# Patient Record
Sex: Male | Born: 1975
Health system: Southern US, Community
[De-identification: ages and names within clinical notes are randomized; demographics above are authoritative.]

## PROBLEM LIST (undated history)

## (undated) DIAGNOSIS — F819 Developmental disorder of scholastic skills, unspecified: Secondary | ICD-10-CM

## (undated) DIAGNOSIS — K219 Gastro-esophageal reflux disease without esophagitis: Secondary | ICD-10-CM

## (undated) DIAGNOSIS — F319 Bipolar disorder, unspecified: Secondary | ICD-10-CM

---

## 2003-11-29 ENCOUNTER — Encounter: Admission: RE | Admit: 2003-11-29 | Discharge: 2003-11-29 | Payer: Self-pay | Admitting: Family Medicine

## 2009-02-10 ENCOUNTER — Encounter: Admission: RE | Admit: 2009-02-10 | Discharge: 2009-02-10 | Payer: Self-pay | Admitting: Gastroenterology

## 2009-04-09 ENCOUNTER — Ambulatory Visit (HOSPITAL_COMMUNITY): Admission: RE | Admit: 2009-04-09 | Discharge: 2009-04-09 | Payer: Self-pay | Admitting: Gastroenterology

## 2009-05-03 HISTORY — PX: LAPAROSCOPIC CHOLECYSTECTOMY: SUR755

## 2009-05-08 ENCOUNTER — Encounter (INDEPENDENT_AMBULATORY_CARE_PROVIDER_SITE_OTHER): Payer: Self-pay | Admitting: Surgery

## 2009-05-08 ENCOUNTER — Ambulatory Visit (HOSPITAL_COMMUNITY): Admission: RE | Admit: 2009-05-08 | Discharge: 2009-05-09 | Payer: Self-pay | Admitting: Surgery

## 2010-05-24 ENCOUNTER — Encounter: Payer: Self-pay | Admitting: Gastroenterology

## 2010-07-19 LAB — DIFFERENTIAL
Basophils Relative: 0 % (ref 0–1)
Lymphs Abs: 0.5 10*3/uL — ABNORMAL LOW (ref 0.7–4.0)
Monocytes Relative: 1 % — ABNORMAL LOW (ref 3–12)
Neutrophils Relative %: 96 % — ABNORMAL HIGH (ref 43–77)

## 2010-07-19 LAB — CBC
HCT: 45.6 % (ref 39.0–52.0)
MCV: 90.1 fL (ref 78.0–100.0)
Platelets: 178 10*3/uL (ref 150–400)
RBC: 5.06 MIL/uL (ref 4.22–5.81)

## 2010-07-19 LAB — COMPREHENSIVE METABOLIC PANEL
ALT: 27 U/L (ref 0–53)
AST: 31 U/L (ref 0–37)
Albumin: 3.6 g/dL (ref 3.5–5.2)
Alkaline Phosphatase: 79 U/L (ref 39–117)
CO2: 22 mEq/L (ref 19–32)
Creatinine, Ser: 0.93 mg/dL (ref 0.4–1.5)
GFR calc non Af Amer: >60 mL/min (ref >60)
Total Bilirubin: 0.6 mg/dL (ref 0.3–1.2)

## 2012-05-10 DIAGNOSIS — F3189 Other bipolar disorder: Secondary | ICD-10-CM | POA: Diagnosis not present

## 2012-06-20 DIAGNOSIS — Z79899 Other long term (current) drug therapy: Secondary | ICD-10-CM | POA: Diagnosis not present

## 2012-07-20 DIAGNOSIS — Z79899 Other long term (current) drug therapy: Secondary | ICD-10-CM | POA: Diagnosis not present

## 2012-08-15 DIAGNOSIS — F3189 Other bipolar disorder: Secondary | ICD-10-CM | POA: Diagnosis not present

## 2012-08-16 DIAGNOSIS — Z79899 Other long term (current) drug therapy: Secondary | ICD-10-CM | POA: Diagnosis not present

## 2012-09-19 DIAGNOSIS — Z79899 Other long term (current) drug therapy: Secondary | ICD-10-CM | POA: Diagnosis not present

## 2012-09-29 DIAGNOSIS — Z79899 Other long term (current) drug therapy: Secondary | ICD-10-CM | POA: Diagnosis not present

## 2012-09-29 DIAGNOSIS — F319 Bipolar disorder, unspecified: Secondary | ICD-10-CM | POA: Diagnosis not present

## 2012-09-29 DIAGNOSIS — D485 Neoplasm of uncertain behavior of skin: Secondary | ICD-10-CM | POA: Diagnosis not present

## 2012-09-29 DIAGNOSIS — E7800 Pure hypercholesterolemia, unspecified: Secondary | ICD-10-CM | POA: Diagnosis not present

## 2012-09-29 DIAGNOSIS — F79 Unspecified intellectual disabilities: Secondary | ICD-10-CM | POA: Diagnosis not present

## 2012-09-29 DIAGNOSIS — Z833 Family history of diabetes mellitus: Secondary | ICD-10-CM | POA: Diagnosis not present

## 2012-10-17 DIAGNOSIS — D2360 Other benign neoplasm of skin of unspecified upper limb, including shoulder: Secondary | ICD-10-CM | POA: Diagnosis not present

## 2012-10-18 ENCOUNTER — Other Ambulatory Visit: Payer: Self-pay | Admitting: Family Medicine

## 2012-10-18 DIAGNOSIS — D485 Neoplasm of uncertain behavior of skin: Secondary | ICD-10-CM | POA: Diagnosis not present

## 2012-10-20 DIAGNOSIS — Z79899 Other long term (current) drug therapy: Secondary | ICD-10-CM | POA: Diagnosis not present

## 2012-11-07 DIAGNOSIS — F3189 Other bipolar disorder: Secondary | ICD-10-CM | POA: Diagnosis not present

## 2012-11-17 DIAGNOSIS — Z79899 Other long term (current) drug therapy: Secondary | ICD-10-CM | POA: Diagnosis not present

## 2012-12-15 DIAGNOSIS — Z79899 Other long term (current) drug therapy: Secondary | ICD-10-CM | POA: Diagnosis not present

## 2013-01-18 DIAGNOSIS — Z79899 Other long term (current) drug therapy: Secondary | ICD-10-CM | POA: Diagnosis not present

## 2013-01-30 DIAGNOSIS — F3189 Other bipolar disorder: Secondary | ICD-10-CM | POA: Diagnosis not present

## 2013-02-16 DIAGNOSIS — Z79899 Other long term (current) drug therapy: Secondary | ICD-10-CM | POA: Diagnosis not present

## 2013-03-15 DIAGNOSIS — Z79899 Other long term (current) drug therapy: Secondary | ICD-10-CM | POA: Diagnosis not present

## 2013-04-10 DIAGNOSIS — F70 Mild intellectual disabilities: Secondary | ICD-10-CM | POA: Diagnosis not present

## 2013-04-12 DIAGNOSIS — Z79899 Other long term (current) drug therapy: Secondary | ICD-10-CM | POA: Diagnosis not present

## 2013-05-14 DIAGNOSIS — Z79899 Other long term (current) drug therapy: Secondary | ICD-10-CM | POA: Diagnosis not present

## 2013-06-14 DIAGNOSIS — Z79899 Other long term (current) drug therapy: Secondary | ICD-10-CM | POA: Diagnosis not present

## 2013-07-17 DIAGNOSIS — Z79899 Other long term (current) drug therapy: Secondary | ICD-10-CM | POA: Diagnosis not present

## 2013-08-14 DIAGNOSIS — F70 Mild intellectual disabilities: Secondary | ICD-10-CM | POA: Diagnosis not present

## 2013-08-14 DIAGNOSIS — F3130 Bipolar disorder, current episode depressed, mild or moderate severity, unspecified: Secondary | ICD-10-CM | POA: Diagnosis not present

## 2013-08-15 DIAGNOSIS — Z79899 Other long term (current) drug therapy: Secondary | ICD-10-CM | POA: Diagnosis not present

## 2013-09-17 DIAGNOSIS — Z79899 Other long term (current) drug therapy: Secondary | ICD-10-CM | POA: Diagnosis not present

## 2013-10-05 DIAGNOSIS — Z0000 Encounter for general adult medical examination without abnormal findings: Secondary | ICD-10-CM | POA: Diagnosis not present

## 2013-10-05 DIAGNOSIS — Z79899 Other long term (current) drug therapy: Secondary | ICD-10-CM | POA: Diagnosis not present

## 2013-10-05 DIAGNOSIS — Z131 Encounter for screening for diabetes mellitus: Secondary | ICD-10-CM | POA: Diagnosis not present

## 2013-10-05 DIAGNOSIS — F319 Bipolar disorder, unspecified: Secondary | ICD-10-CM | POA: Diagnosis not present

## 2013-10-05 DIAGNOSIS — Z136 Encounter for screening for cardiovascular disorders: Secondary | ICD-10-CM | POA: Diagnosis not present

## 2013-10-15 DIAGNOSIS — Z79899 Other long term (current) drug therapy: Secondary | ICD-10-CM | POA: Diagnosis not present

## 2013-11-06 DIAGNOSIS — F3130 Bipolar disorder, current episode depressed, mild or moderate severity, unspecified: Secondary | ICD-10-CM | POA: Diagnosis not present

## 2013-11-06 DIAGNOSIS — F70 Mild intellectual disabilities: Secondary | ICD-10-CM | POA: Diagnosis not present

## 2013-11-14 DIAGNOSIS — Z79899 Other long term (current) drug therapy: Secondary | ICD-10-CM | POA: Diagnosis not present

## 2013-11-20 ENCOUNTER — Other Ambulatory Visit: Payer: Self-pay | Admitting: Family Medicine

## 2013-11-20 DIAGNOSIS — D235 Other benign neoplasm of skin of trunk: Secondary | ICD-10-CM | POA: Diagnosis not present

## 2013-12-07 DIAGNOSIS — B029 Zoster without complications: Secondary | ICD-10-CM | POA: Diagnosis not present

## 2013-12-07 DIAGNOSIS — L0100 Impetigo, unspecified: Secondary | ICD-10-CM | POA: Diagnosis not present

## 2013-12-10 DIAGNOSIS — L0100 Impetigo, unspecified: Secondary | ICD-10-CM | POA: Diagnosis not present

## 2013-12-10 DIAGNOSIS — B029 Zoster without complications: Secondary | ICD-10-CM | POA: Diagnosis not present

## 2013-12-17 DIAGNOSIS — Z79899 Other long term (current) drug therapy: Secondary | ICD-10-CM | POA: Diagnosis not present

## 2014-01-15 DIAGNOSIS — Z79899 Other long term (current) drug therapy: Secondary | ICD-10-CM | POA: Diagnosis not present

## 2014-02-05 DIAGNOSIS — F70 Mild intellectual disabilities: Secondary | ICD-10-CM | POA: Diagnosis not present

## 2014-02-05 DIAGNOSIS — F3181 Bipolar II disorder: Secondary | ICD-10-CM | POA: Diagnosis not present

## 2014-02-11 DIAGNOSIS — Z79899 Other long term (current) drug therapy: Secondary | ICD-10-CM | POA: Diagnosis not present

## 2014-03-18 DIAGNOSIS — Z79899 Other long term (current) drug therapy: Secondary | ICD-10-CM | POA: Diagnosis not present

## 2014-04-15 DIAGNOSIS — Z79899 Other long term (current) drug therapy: Secondary | ICD-10-CM | POA: Diagnosis not present

## 2014-04-16 DIAGNOSIS — R590 Localized enlarged lymph nodes: Secondary | ICD-10-CM | POA: Diagnosis not present

## 2014-04-16 DIAGNOSIS — H00015 Hordeolum externum left lower eyelid: Secondary | ICD-10-CM | POA: Diagnosis not present

## 2014-04-16 DIAGNOSIS — D2220 Melanocytic nevi of unspecified ear and external auricular canal: Secondary | ICD-10-CM | POA: Diagnosis not present

## 2014-05-06 DIAGNOSIS — H029 Unspecified disorder of eyelid: Secondary | ICD-10-CM | POA: Diagnosis not present

## 2014-05-06 DIAGNOSIS — D2220 Melanocytic nevi of unspecified ear and external auricular canal: Secondary | ICD-10-CM | POA: Diagnosis not present

## 2014-05-08 DIAGNOSIS — F70 Mild intellectual disabilities: Secondary | ICD-10-CM | POA: Diagnosis not present

## 2014-05-08 DIAGNOSIS — F3174 Bipolar disorder, in full remission, most recent episode manic: Secondary | ICD-10-CM | POA: Diagnosis not present

## 2014-05-13 DIAGNOSIS — H02825 Cysts of left lower eyelid: Secondary | ICD-10-CM | POA: Diagnosis not present

## 2014-05-15 DIAGNOSIS — Z79899 Other long term (current) drug therapy: Secondary | ICD-10-CM | POA: Diagnosis not present

## 2014-05-21 ENCOUNTER — Other Ambulatory Visit: Payer: Self-pay | Admitting: Dermatology

## 2014-05-21 DIAGNOSIS — D2221 Melanocytic nevi of right ear and external auricular canal: Secondary | ICD-10-CM | POA: Diagnosis not present

## 2014-05-21 DIAGNOSIS — L821 Other seborrheic keratosis: Secondary | ICD-10-CM | POA: Diagnosis not present

## 2014-05-21 DIAGNOSIS — D2312 Other benign neoplasm of skin of left eyelid, including canthus: Secondary | ICD-10-CM | POA: Diagnosis not present

## 2014-06-13 DIAGNOSIS — Z79899 Other long term (current) drug therapy: Secondary | ICD-10-CM | POA: Diagnosis not present

## 2014-07-09 DIAGNOSIS — B351 Tinea unguium: Secondary | ICD-10-CM | POA: Diagnosis not present

## 2014-07-18 DIAGNOSIS — Z79899 Other long term (current) drug therapy: Secondary | ICD-10-CM | POA: Diagnosis not present

## 2014-08-07 DIAGNOSIS — F3132 Bipolar disorder, current episode depressed, moderate: Secondary | ICD-10-CM | POA: Diagnosis not present

## 2014-08-07 DIAGNOSIS — F70 Mild intellectual disabilities: Secondary | ICD-10-CM | POA: Diagnosis not present

## 2014-08-08 DIAGNOSIS — Z79899 Other long term (current) drug therapy: Secondary | ICD-10-CM | POA: Diagnosis not present

## 2014-09-09 DIAGNOSIS — Z79899 Other long term (current) drug therapy: Secondary | ICD-10-CM | POA: Diagnosis not present

## 2014-10-16 DIAGNOSIS — Z79899 Other long term (current) drug therapy: Secondary | ICD-10-CM | POA: Diagnosis not present

## 2014-10-24 DIAGNOSIS — Z0001 Encounter for general adult medical examination with abnormal findings: Secondary | ICD-10-CM | POA: Diagnosis not present

## 2014-10-24 DIAGNOSIS — F319 Bipolar disorder, unspecified: Secondary | ICD-10-CM | POA: Diagnosis not present

## 2014-10-24 DIAGNOSIS — F79 Unspecified intellectual disabilities: Secondary | ICD-10-CM | POA: Diagnosis not present

## 2014-11-18 DIAGNOSIS — Z79899 Other long term (current) drug therapy: Secondary | ICD-10-CM | POA: Diagnosis not present

## 2014-12-17 DIAGNOSIS — F70 Mild intellectual disabilities: Secondary | ICD-10-CM | POA: Diagnosis not present

## 2014-12-17 DIAGNOSIS — F3132 Bipolar disorder, current episode depressed, moderate: Secondary | ICD-10-CM | POA: Diagnosis not present

## 2014-12-19 DIAGNOSIS — Z79899 Other long term (current) drug therapy: Secondary | ICD-10-CM | POA: Diagnosis not present

## 2015-01-20 DIAGNOSIS — Z79899 Other long term (current) drug therapy: Secondary | ICD-10-CM | POA: Diagnosis not present

## 2015-02-20 DIAGNOSIS — Z79899 Other long term (current) drug therapy: Secondary | ICD-10-CM | POA: Diagnosis not present

## 2015-03-11 DIAGNOSIS — F70 Mild intellectual disabilities: Secondary | ICD-10-CM | POA: Diagnosis not present

## 2015-03-11 DIAGNOSIS — F3132 Bipolar disorder, current episode depressed, moderate: Secondary | ICD-10-CM | POA: Diagnosis not present

## 2015-03-25 DIAGNOSIS — Z79899 Other long term (current) drug therapy: Secondary | ICD-10-CM | POA: Diagnosis not present

## 2015-04-23 DIAGNOSIS — Z79899 Other long term (current) drug therapy: Secondary | ICD-10-CM | POA: Diagnosis not present

## 2015-05-22 DIAGNOSIS — L723 Sebaceous cyst: Secondary | ICD-10-CM | POA: Diagnosis not present

## 2015-05-26 DIAGNOSIS — Z79899 Other long term (current) drug therapy: Secondary | ICD-10-CM | POA: Diagnosis not present

## 2015-06-26 DIAGNOSIS — Z79899 Other long term (current) drug therapy: Secondary | ICD-10-CM | POA: Diagnosis not present

## 2015-07-23 DIAGNOSIS — Z79899 Other long term (current) drug therapy: Secondary | ICD-10-CM | POA: Diagnosis not present

## 2015-08-25 DIAGNOSIS — Z79899 Other long term (current) drug therapy: Secondary | ICD-10-CM | POA: Diagnosis not present

## 2015-08-26 DIAGNOSIS — F3132 Bipolar disorder, current episode depressed, moderate: Secondary | ICD-10-CM | POA: Diagnosis not present

## 2015-08-26 DIAGNOSIS — F70 Mild intellectual disabilities: Secondary | ICD-10-CM | POA: Diagnosis not present

## 2015-09-25 DIAGNOSIS — Z79899 Other long term (current) drug therapy: Secondary | ICD-10-CM | POA: Diagnosis not present

## 2015-10-27 DIAGNOSIS — Z79899 Other long term (current) drug therapy: Secondary | ICD-10-CM | POA: Diagnosis not present

## 2015-10-31 DIAGNOSIS — F79 Unspecified intellectual disabilities: Secondary | ICD-10-CM | POA: Diagnosis not present

## 2015-10-31 DIAGNOSIS — Z131 Encounter for screening for diabetes mellitus: Secondary | ICD-10-CM | POA: Diagnosis not present

## 2015-10-31 DIAGNOSIS — Z0001 Encounter for general adult medical examination with abnormal findings: Secondary | ICD-10-CM | POA: Diagnosis not present

## 2015-10-31 DIAGNOSIS — L723 Sebaceous cyst: Secondary | ICD-10-CM | POA: Diagnosis not present

## 2015-10-31 DIAGNOSIS — M24569 Contracture, unspecified knee: Secondary | ICD-10-CM | POA: Diagnosis not present

## 2015-10-31 DIAGNOSIS — E7800 Pure hypercholesterolemia, unspecified: Secondary | ICD-10-CM | POA: Diagnosis not present

## 2015-10-31 DIAGNOSIS — B081 Molluscum contagiosum: Secondary | ICD-10-CM | POA: Diagnosis not present

## 2015-11-26 DIAGNOSIS — Z79899 Other long term (current) drug therapy: Secondary | ICD-10-CM | POA: Diagnosis not present

## 2015-12-25 DIAGNOSIS — Z79899 Other long term (current) drug therapy: Secondary | ICD-10-CM | POA: Diagnosis not present

## 2016-01-30 DIAGNOSIS — Z79899 Other long term (current) drug therapy: Secondary | ICD-10-CM | POA: Diagnosis not present

## 2016-02-03 DIAGNOSIS — F3132 Bipolar disorder, current episode depressed, moderate: Secondary | ICD-10-CM | POA: Diagnosis not present

## 2016-02-03 DIAGNOSIS — F209 Schizophrenia, unspecified: Secondary | ICD-10-CM | POA: Diagnosis not present

## 2016-02-26 DIAGNOSIS — F3132 Bipolar disorder, current episode depressed, moderate: Secondary | ICD-10-CM | POA: Diagnosis not present

## 2016-02-26 DIAGNOSIS — F209 Schizophrenia, unspecified: Secondary | ICD-10-CM | POA: Diagnosis not present

## 2016-03-23 DIAGNOSIS — H01006 Unspecified blepharitis left eye, unspecified eyelid: Secondary | ICD-10-CM | POA: Diagnosis not present

## 2016-04-01 DIAGNOSIS — Z79899 Other long term (current) drug therapy: Secondary | ICD-10-CM | POA: Diagnosis not present

## 2016-04-01 DIAGNOSIS — F209 Schizophrenia, unspecified: Secondary | ICD-10-CM | POA: Diagnosis not present

## 2016-04-01 DIAGNOSIS — F3132 Bipolar disorder, current episode depressed, moderate: Secondary | ICD-10-CM | POA: Diagnosis not present

## 2016-04-21 DIAGNOSIS — F28 Other psychotic disorder not due to a substance or known physiological condition: Secondary | ICD-10-CM | POA: Diagnosis not present

## 2016-04-21 DIAGNOSIS — F70 Mild intellectual disabilities: Secondary | ICD-10-CM | POA: Diagnosis not present

## 2016-04-29 DIAGNOSIS — F3132 Bipolar disorder, current episode depressed, moderate: Secondary | ICD-10-CM | POA: Diagnosis not present

## 2016-04-29 DIAGNOSIS — F209 Schizophrenia, unspecified: Secondary | ICD-10-CM | POA: Diagnosis not present

## 2016-05-28 DIAGNOSIS — F3132 Bipolar disorder, current episode depressed, moderate: Secondary | ICD-10-CM | POA: Diagnosis not present

## 2016-05-28 DIAGNOSIS — F209 Schizophrenia, unspecified: Secondary | ICD-10-CM | POA: Diagnosis not present

## 2016-05-31 DIAGNOSIS — B351 Tinea unguium: Secondary | ICD-10-CM | POA: Diagnosis not present

## 2016-06-11 DIAGNOSIS — B351 Tinea unguium: Secondary | ICD-10-CM | POA: Diagnosis not present

## 2016-06-29 DIAGNOSIS — F209 Schizophrenia, unspecified: Secondary | ICD-10-CM | POA: Diagnosis not present

## 2016-06-29 DIAGNOSIS — F3132 Bipolar disorder, current episode depressed, moderate: Secondary | ICD-10-CM | POA: Diagnosis not present

## 2016-07-21 DIAGNOSIS — F28 Other psychotic disorder not due to a substance or known physiological condition: Secondary | ICD-10-CM | POA: Diagnosis not present

## 2016-07-21 DIAGNOSIS — F70 Mild intellectual disabilities: Secondary | ICD-10-CM | POA: Diagnosis not present

## 2016-08-02 DIAGNOSIS — F3132 Bipolar disorder, current episode depressed, moderate: Secondary | ICD-10-CM | POA: Diagnosis not present

## 2016-08-02 DIAGNOSIS — F209 Schizophrenia, unspecified: Secondary | ICD-10-CM | POA: Diagnosis not present

## 2016-08-30 DIAGNOSIS — F3132 Bipolar disorder, current episode depressed, moderate: Secondary | ICD-10-CM | POA: Diagnosis not present

## 2016-08-30 DIAGNOSIS — F209 Schizophrenia, unspecified: Secondary | ICD-10-CM | POA: Diagnosis not present

## 2016-09-30 DIAGNOSIS — F3132 Bipolar disorder, current episode depressed, moderate: Secondary | ICD-10-CM | POA: Diagnosis not present

## 2016-09-30 DIAGNOSIS — F209 Schizophrenia, unspecified: Secondary | ICD-10-CM | POA: Diagnosis not present

## 2016-10-26 DIAGNOSIS — F70 Mild intellectual disabilities: Secondary | ICD-10-CM | POA: Diagnosis not present

## 2016-10-26 DIAGNOSIS — F28 Other psychotic disorder not due to a substance or known physiological condition: Secondary | ICD-10-CM | POA: Diagnosis not present

## 2016-10-27 DIAGNOSIS — F3132 Bipolar disorder, current episode depressed, moderate: Secondary | ICD-10-CM | POA: Diagnosis not present

## 2016-10-27 DIAGNOSIS — F209 Schizophrenia, unspecified: Secondary | ICD-10-CM | POA: Diagnosis not present

## 2016-11-05 DIAGNOSIS — Z79899 Other long term (current) drug therapy: Secondary | ICD-10-CM | POA: Diagnosis not present

## 2016-11-22 DIAGNOSIS — F3181 Bipolar II disorder: Secondary | ICD-10-CM | POA: Diagnosis not present

## 2016-11-22 DIAGNOSIS — M24569 Contracture, unspecified knee: Secondary | ICD-10-CM | POA: Diagnosis not present

## 2016-11-22 DIAGNOSIS — F79 Unspecified intellectual disabilities: Secondary | ICD-10-CM | POA: Diagnosis not present

## 2016-11-22 DIAGNOSIS — Z79899 Other long term (current) drug therapy: Secondary | ICD-10-CM | POA: Diagnosis not present

## 2016-11-22 DIAGNOSIS — Z0000 Encounter for general adult medical examination without abnormal findings: Secondary | ICD-10-CM | POA: Diagnosis not present

## 2017-01-06 DIAGNOSIS — F3132 Bipolar disorder, current episode depressed, moderate: Secondary | ICD-10-CM | POA: Diagnosis not present

## 2017-01-06 DIAGNOSIS — F209 Schizophrenia, unspecified: Secondary | ICD-10-CM | POA: Diagnosis not present

## 2017-02-03 DIAGNOSIS — F209 Schizophrenia, unspecified: Secondary | ICD-10-CM | POA: Diagnosis not present

## 2017-02-03 DIAGNOSIS — F3132 Bipolar disorder, current episode depressed, moderate: Secondary | ICD-10-CM | POA: Diagnosis not present

## 2017-03-07 DIAGNOSIS — F28 Other psychotic disorder not due to a substance or known physiological condition: Secondary | ICD-10-CM | POA: Diagnosis not present

## 2017-03-07 DIAGNOSIS — F209 Schizophrenia, unspecified: Secondary | ICD-10-CM | POA: Diagnosis not present

## 2017-03-07 DIAGNOSIS — F70 Mild intellectual disabilities: Secondary | ICD-10-CM | POA: Diagnosis not present

## 2017-04-05 DIAGNOSIS — F209 Schizophrenia, unspecified: Secondary | ICD-10-CM | POA: Diagnosis not present

## 2017-04-05 DIAGNOSIS — F3132 Bipolar disorder, current episode depressed, moderate: Secondary | ICD-10-CM | POA: Diagnosis not present

## 2017-05-11 DIAGNOSIS — Z79899 Other long term (current) drug therapy: Secondary | ICD-10-CM | POA: Diagnosis not present

## 2017-05-11 DIAGNOSIS — F209 Schizophrenia, unspecified: Secondary | ICD-10-CM | POA: Diagnosis not present

## 2017-06-15 DIAGNOSIS — F28 Other psychotic disorder not due to a substance or known physiological condition: Secondary | ICD-10-CM | POA: Diagnosis not present

## 2017-06-15 DIAGNOSIS — Z79899 Other long term (current) drug therapy: Secondary | ICD-10-CM | POA: Diagnosis not present

## 2017-06-15 DIAGNOSIS — F70 Mild intellectual disabilities: Secondary | ICD-10-CM | POA: Diagnosis not present

## 2017-07-12 DIAGNOSIS — Z79899 Other long term (current) drug therapy: Secondary | ICD-10-CM | POA: Diagnosis not present

## 2017-08-02 ENCOUNTER — Encounter (HOSPITAL_COMMUNITY): Payer: Self-pay | Admitting: Internal Medicine

## 2017-08-02 ENCOUNTER — Inpatient Hospital Stay (HOSPITAL_COMMUNITY)
Admission: EM | Admit: 2017-08-02 | Discharge: 2017-08-06 | DRG: 381 | Disposition: A | Discharge status: Home / Self Care | Payer: Medicare Other | Attending: Internal Medicine | Admitting: Internal Medicine

## 2017-08-02 DIAGNOSIS — R000 Tachycardia, unspecified: Secondary | ICD-10-CM | POA: Diagnosis present

## 2017-08-02 DIAGNOSIS — Z888 Allergy status to other drugs, medicaments and biological substances status: Secondary | ICD-10-CM | POA: Diagnosis not present

## 2017-08-02 DIAGNOSIS — D62 Acute posthemorrhagic anemia: Secondary | ICD-10-CM | POA: Diagnosis present

## 2017-08-02 DIAGNOSIS — Z6832 Body mass index (BMI) 32.0-32.9, adult: Secondary | ICD-10-CM | POA: Diagnosis not present

## 2017-08-02 DIAGNOSIS — F79 Unspecified intellectual disabilities: Secondary | ICD-10-CM | POA: Diagnosis present

## 2017-08-02 DIAGNOSIS — D72829 Elevated white blood cell count, unspecified: Secondary | ICD-10-CM | POA: Diagnosis present

## 2017-08-02 DIAGNOSIS — K920 Hematemesis: Secondary | ICD-10-CM | POA: Diagnosis not present

## 2017-08-02 DIAGNOSIS — K2211 Ulcer of esophagus with bleeding: Secondary | ICD-10-CM | POA: Diagnosis present

## 2017-08-02 DIAGNOSIS — K209 Esophagitis, unspecified: Secondary | ICD-10-CM | POA: Diagnosis not present

## 2017-08-02 DIAGNOSIS — K210 Gastro-esophageal reflux disease with esophagitis: Secondary | ICD-10-CM | POA: Diagnosis present

## 2017-08-02 DIAGNOSIS — E876 Hypokalemia: Secondary | ICD-10-CM | POA: Diagnosis present

## 2017-08-02 DIAGNOSIS — Z7982 Long term (current) use of aspirin: Secondary | ICD-10-CM | POA: Diagnosis not present

## 2017-08-02 DIAGNOSIS — R109 Unspecified abdominal pain: Secondary | ICD-10-CM | POA: Diagnosis not present

## 2017-08-02 DIAGNOSIS — E669 Obesity, unspecified: Secondary | ICD-10-CM | POA: Diagnosis present

## 2017-08-02 DIAGNOSIS — K922 Gastrointestinal hemorrhage, unspecified: Secondary | ICD-10-CM | POA: Diagnosis not present

## 2017-08-02 DIAGNOSIS — F319 Bipolar disorder, unspecified: Secondary | ICD-10-CM | POA: Diagnosis present

## 2017-08-02 DIAGNOSIS — K449 Diaphragmatic hernia without obstruction or gangrene: Secondary | ICD-10-CM | POA: Diagnosis present

## 2017-08-02 DIAGNOSIS — D649 Anemia, unspecified: Secondary | ICD-10-CM | POA: Diagnosis not present

## 2017-08-02 HISTORY — DX: Bipolar disorder, unspecified: F31.9

## 2017-08-02 HISTORY — DX: Developmental disorder of scholastic skills, unspecified: F81.9

## 2017-08-02 LAB — COMPREHENSIVE METABOLIC PANEL
ALBUMIN: 3.3 g/dL — AB (ref 3.5–5.0)
ALK PHOS: 69 U/L (ref 38–126)
ALT: 20 U/L (ref 17–63)
ANION GAP: 6 (ref 5–15)
AST: 22 U/L (ref 15–41)
BILIRUBIN TOTAL: 0.6 mg/dL (ref 0.3–1.2)
BUN: 26 mg/dL — AB (ref 6–20)
CALCIUM: 8.8 mg/dL — AB (ref 8.9–10.3)
CO2: 25 mmol/L (ref 22–32)
CREATININE: 0.83 mg/dL (ref 0.61–1.24)
Chloride: 103 mmol/L (ref 101–111)
GFR calc Af Amer: >60 mL/min (ref >60)
GFR calc non Af Amer: >60 mL/min (ref >60)
GLUCOSE: 136 mg/dL — AB (ref 65–99)
Potassium: 3.4 mmol/L — ABNORMAL LOW (ref 3.5–5.1)
SODIUM: 134 mmol/L — AB (ref 135–145)
TOTAL PROTEIN: 6.6 g/dL (ref 6.5–8.1)

## 2017-08-02 LAB — URINALYSIS, ROUTINE W REFLEX MICROSCOPIC
Bilirubin Urine: NEGATIVE
GLUCOSE, UA: NEGATIVE mg/dL
Hgb urine dipstick: NEGATIVE
KETONES UR: 5 mg/dL — AB
Leukocytes, UA: NEGATIVE
NITRITE: NEGATIVE
PH: 5 (ref 5.0–8.0)
Protein, ur: 30 mg/dL — AB
Specific Gravity, Urine: 1.028 (ref 1.005–1.030)
Squamous Epithelial / LPF: NONE SEEN

## 2017-08-02 LAB — CBC
HCT: 39.4 % (ref 39.0–52.0)
Hemoglobin: 13.2 g/dL (ref 13.0–17.0)
MCH: 30.3 pg (ref 26.0–34.0)
MCHC: 33.5 g/dL (ref 30.0–36.0)
MCV: 90.4 fL (ref 78.0–100.0)
PLATELETS: 266 10*3/uL (ref 150–400)
RBC: 4.36 MIL/uL (ref 4.22–5.81)
RDW: 14.5 % (ref 11.5–15.5)
WBC: 16.6 10*3/uL — ABNORMAL HIGH (ref 4.0–10.5)

## 2017-08-02 LAB — TYPE AND SCREEN
ABO/RH(D): O POS
ANTIBODY SCREEN: NEGATIVE

## 2017-08-02 LAB — LIPASE, BLOOD: Lipase: 27 U/L (ref 11–51)

## 2017-08-02 LAB — HEMOGLOBIN AND HEMATOCRIT, BLOOD
HCT: 35 % — ABNORMAL LOW (ref 39.0–52.0)
HEMOGLOBIN: 11.3 g/dL — AB (ref 13.0–17.0)

## 2017-08-02 MED ORDER — SODIUM CHLORIDE 0.9 % IV BOLUS
1000.0000 mL | Freq: Once | INTRAVENOUS | Status: AC
  Administered 2017-08-02: 1000 mL via INTRAVENOUS

## 2017-08-02 MED ORDER — CLOZAPINE 100 MG PO TABS
400.0000 mg | ORAL_TABLET | Freq: Every evening | ORAL | Status: DC
  Administered 2017-08-03 – 2017-08-05 (×3): 400 mg via ORAL
  Filled 2017-08-02 (×5): qty 4

## 2017-08-02 MED ORDER — ACETAMINOPHEN 325 MG PO TABS
650.0000 mg | ORAL_TABLET | Freq: Four times a day (QID) | ORAL | Status: DC | PRN
  Administered 2017-08-05: 650 mg via ORAL
  Filled 2017-08-02: qty 2

## 2017-08-02 MED ORDER — ONDANSETRON HCL 4 MG PO TABS: 4.0000 mg | ORAL_TABLET | Freq: Four times a day (QID) | ORAL | Status: DC | PRN

## 2017-08-02 MED ORDER — ACETAMINOPHEN 650 MG RE SUPP: 650.0000 mg | Freq: Four times a day (QID) | RECTAL | Status: DC | PRN

## 2017-08-02 MED ORDER — PANTOPRAZOLE SODIUM 40 MG IV SOLR
40.0000 mg | Freq: Once | INTRAVENOUS | Status: AC
  Administered 2017-08-02: 40 mg via INTRAVENOUS
  Filled 2017-08-02: qty 40

## 2017-08-02 MED ORDER — SODIUM CHLORIDE 0.9 % IV SOLN
INTRAVENOUS | Status: DC
  Administered 2017-08-03 – 2017-08-05 (×4): via INTRAVENOUS

## 2017-08-02 MED ORDER — ONDANSETRON HCL 4 MG/2ML IJ SOLN
4.0000 mg | Freq: Four times a day (QID) | INTRAMUSCULAR | Status: DC | PRN
  Filled 2017-08-02: qty 2

## 2017-08-02 NOTE — ED Notes (Signed)
Patient denies pain and is resting comfortably.  

## 2017-08-02 NOTE — ED Provider Notes (Signed)
[facility] DEPT Provider Note   CSN: [id number] Arrival date & time: 08/02/17  1726     History   Chief Complaint Chief Complaint  Patient presents with  . Hematemesis  . Abdominal Pain    HPI Spencer Cummings is a 42 y.o. male.  HPI   Patient is a 42 year old male with past medical history significant for mental retardation.  Patient is MR and unable to tell history.  According to group home patient had large episode of vomiting that included both bright red blood and also coffee-ground emesis.  He vomited twice more coffee-ground emesis.  On arrival.  Patient appears pale, tachycardic.  Patient unable to give a history secondary to baseline mental status.  Takes daily aspirin.  No increase in NSAIDs use.  Patient not an alcohol abuser.  No past medical history on file.  There are no active problems to display for this patient.        Home Medications    Prior to Admission medications   Medication Sig Start Date End Date Taking? Authorizing Provider  aspirin EC 81 MG tablet Take 325 mg by mouth daily.   Yes [provider]  cloZAPine (CLOZARIL) 100 MG tablet Take 400 mg by mouth every evening. 07/12/17  Yes [provider]    Family History No family history on file.  Social History Social History   Tobacco Use  . Smoking status: Not on file  Substance Use Topics  . Alcohol use: Not on file  . Drug use: Not on file     Allergies   Patient has no allergy information on record.   Review of Systems Review of Systems  Unable to perform ROS: Patient nonverbal     Physical Exam Updated Vital Signs BP 127/78   Pulse (!) 115   Temp 98 F (36.7 C) (Oral)   Resp 18   SpO2 98%   Physical Exam  Constitutional: He is oriented to person, place, and time. He appears well-nourished.  HENT:  Head: Normocephalic.  Eyes: Conjunctivae are normal.  Cardiovascular:  tachycardia  Pulmonary/Chest: Effort normal.    Abdominal: Normal appearance and bowel sounds are normal. He exhibits no distension. There is no tenderness.  Neurological: He is oriented to person, place, and time.  Skin: Skin is warm and dry. He is not diaphoretic.  Psychiatric: He has a normal mood and affect. His behavior is normal.  Nursing note and vitals reviewed.    ED Treatments / Results  Labs (all labs ordered are listed, but only abnormal results are displayed) Labs Reviewed  COMPREHENSIVE METABOLIC PANEL - Abnormal; Notable for the following components:      Result Value   Sodium 134 (*)    Potassium 3.4 (*)    Glucose, Bld 136 (*)    BUN 26 (*)    Calcium 8.8 (*)    Albumin 3.3 (*)    All other components within normal limits  CBC - Abnormal; Notable for the following components:   WBC 16.6 (*)    All other components within normal limits  URINALYSIS, ROUTINE W REFLEX MICROSCOPIC - Abnormal; Notable for the following components:   Color, Urine AMBER (*)    Ketones, ur 5 (*)    Protein, ur 30 (*)    Bacteria, UA RARE (*)    All other components within normal limits  LIPASE, BLOOD  TYPE AND SCREEN    EKG None  Radiology No results found.  Procedures Procedures (including  critical care time)  Medications Ordered in ED Medications - No data to display   Initial Impression / Assessment and Plan / ED Course  I have reviewed the triage vital signs and the nursing notes.  Pertinent labs & imaging results that were available during my care of the patient were reviewed by me and considered in my medical decision making (see chart for details).    Patient is a 42 year old male with past medical history significant for mental retardation.  Patient is MR and unable to tell history.  According to group home patient had large episode of vomiting that included both bright red blood and also coffee-ground emesis.  He vomited twice more coffee-ground emesis.  On arrival.  Patient appears pale, tachycardic.   Patient unable to give a history secondary to baseline mental status.  Takes daily aspirin.  No increase in NSAIDs use.  Patient not an alcohol abuser.  7:55 PM Hemoglobin is stable.  However given patient's tachycardia and evidence of ground ground coffee emesis, will admit.  IV Protonix given.  Final Clinical Impressions(s) / ED Diagnoses   Final diagnoses:  None    ED Discharge Orders    None       Macarthur Critchley, MD 08/02/17 1955

## 2017-08-02 NOTE — ED Triage Notes (Signed)
Per group home manager, patient has been reporting generalized abdominal pain x 1 day. Pt also experienced a large hematemesis episode today PTA. Hx of MR.

## 2017-08-02 NOTE — ED Notes (Signed)
ED TO INPATIENT HANDOFF REPORT  Name/Age/Gender Spencer Cummings 42 y.o. male  Code Status    Code Status Orders  (From admission, onward)        Start     Ordered   08/02/17 2031  Full code  Continuous     08/02/17 2033    Code Status History    This patient has a current code status but no historical code status.      Home/SNF/Other Boarding Home  Chief Complaint abdominal pains, vomiting blood  Level of Care/Admitting Diagnosis ED Disposition    ED Disposition Condition Comment   Admit  Hospital Area: [facility] [[id number]]  Level of Care: Telemetry [5]  Admit to tele based on following criteria: Complex arrhythmia (Bradycardia/Tachycardia)  Diagnosis: Hematemesis [578.0.ICD-9-CM]  Admitting Physician: Etta Quill [[id number]]  Attending Physician: Etta Quill [[id number]]  Estimated length of stay: past midnight tomorrow  Certification:: I certify this patient will need inpatient services for at least 2 midnights  PT Class (Do Not Modify): Inpatient [101]  PT Acc Code (Do Not Modify): Private [1]       Medical History Past Medical History:  Diagnosis Date  . Bipolar disorder ([place])   . Cognitive developmental delay     Allergies Allergies  Allergen Reactions  . Lamictal [Lamotrigine] Rash    Tongue thrush, burning, swelling, elbow eczema, sores on knee, finger, wrist, shinn and foot in 2004  . Mellaril [Thioridazine] Other (See Comments)    Neuroleptic malignant syndrome  . Zoloft [Sertraline Hcl] Other (See Comments)    Uncontrolled salvia, weak, limber - occurred very quickly after getting this medication    IV Location/Drains/Wounds Patient Lines/Drains/Airways Status   Active Line/Drains/Airways    None          Labs/Imaging Results for orders placed or performed during the hospital encounter of 08/02/17 (from the past 48 hour(s))  Lipase, blood     Status: None   Collection Time: 08/02/17  5:41 PM  Result Value Ref  Range   Lipase 27 11 - 51 U/L    Comment: Performed at [facility], [place] [address], [place], [place] [address]  Comprehensive metabolic panel     Status: Abnormal   Collection Time: 08/02/17  5:41 PM  Result Value Ref Range   Sodium 134 (L) 135 - 145 mmol/L   Potassium 3.4 (L) 3.5 - 5.1 mmol/L   Chloride 103 101 - 111 mmol/L   CO2 25 22 - 32 mmol/L   Glucose, Bld 136 (H) 65 - 99 mg/dL   BUN 26 (H) 6 - 20 mg/dL   Creatinine, Ser 0.83 0.61 - 1.24 mg/dL   Calcium 8.8 (L) 8.9 - 10.3 mg/dL   Total Protein 6.6 6.5 - 8.1 g/dL   Albumin 3.3 (L) 3.5 - 5.0 g/dL   AST 22 15 - 41 U/L   ALT 20 17 - 63 U/L   Alkaline Phosphatase 69 38 - 126 U/L   Total Bilirubin 0.6 0.3 - 1.2 mg/dL   GFR calc non Af Amer >60 >60 mL/min   GFR calc Af Amer >60 >60 mL/min    Comment: (NOTE) The eGFR has been calculated using the CKD EPI equation. This calculation has not been validated in all clinical situations. eGFR's persistently <60 mL/min signify possible Chronic Kidney Disease.    Anion gap 6 5 - 15    Comment: Performed at [facility], [place] [address] [place], [place] [address]  CBC     Status: Abnormal   Collection Time: 08/02/17  5:41 PM  Result Value Ref Range   WBC 16.6 (H) 4.0 - 10.5 K/uL   RBC 4.36 4.22 - 5.81 MIL/uL   Hemoglobin 13.2 13.0 - 17.0 g/dL   HCT 39.4 39.0 - 52.0 %   MCV 90.4 78.0 - 100.0 fL   MCH 30.3 26.0 - 34.0 pg   MCHC 33.5 30.0 - 36.0 g/dL   RDW 14.5 11.5 - 15.5 %   Platelets 266 150 - 400 K/uL    Comment: Performed at [facility], [place] [address], [place], [place] [address]  Urinalysis, Routine w reflex microscopic     Status: Abnormal   Collection Time: 08/02/17  5:41 PM  Result Value Ref Range   Color, Urine AMBER (A) YELLOW    Comment: BIOCHEMICALS MAY BE AFFECTED BY COLOR   APPearance CLEAR CLEAR   Specific Gravity, Urine 1.028 1.005 - 1.030   pH 5.0 5.0 - 8.0   Glucose, UA NEGATIVE NEGATIVE mg/dL   Hgb  urine dipstick NEGATIVE NEGATIVE   Bilirubin Urine NEGATIVE NEGATIVE   Ketones, ur 5 (A) NEGATIVE mg/dL   Protein, ur 30 (A) NEGATIVE mg/dL   Nitrite NEGATIVE NEGATIVE   Leukocytes, UA NEGATIVE NEGATIVE   RBC / HPF 0-5 0 - 5 RBC/hpf   WBC, UA 0-5 0 - 5 WBC/hpf   Bacteria, UA RARE (A) NONE SEEN   Squamous Epithelial / LPF NONE SEEN NONE SEEN   Mucus PRESENT    Sperm, UA PRESENT     Comment: Performed at [facility], [place] [address] [place], [place] [address]   No results found.  Pending Labs Unresulted Labs (From admission, onward)   Start     Ordered   08/03/17 0600  CBC  Once-Timed,   R     08/02/17 2008   08/03/17 0001  Hemoglobin and hematocrit, blood  Once-Timed,   R     08/02/17 2008   08/02/17 2030  HIV antibody (Routine Testing)  Once,   R     08/02/17 2033   08/02/17 1809  Type and screen [place]  Once,   STAT    Comments:  [place]    08/02/17 1809      Vitals/Pain Today's Vitals   08/02/17 1934 08/02/17 2000 08/02/17 2015 08/02/17 2045  BP:  116/80 116/80 122/82  Pulse:  (!) 117 (!) 119 (!) 118  Resp:   19   Temp:      TempSrc:      SpO2: 97% 99% 97% 97%    Isolation Precautions No active isolations  Medications Medications  cloZAPine (CLOZARIL) tablet 400 mg (has no administration in time range)  sodium chloride 0.9 % bolus 1,000 mL (1,000 mLs Intravenous Transfusing/Transfer 08/02/17 2130)  acetaminophen (TYLENOL) tablet 650 mg (has no administration in time range)    Or  acetaminophen (TYLENOL) suppository 650 mg (has no administration in time range)  ondansetron (ZOFRAN) tablet 4 mg (has no administration in time range)    Or  ondansetron (ZOFRAN) injection 4 mg (has no administration in time range)  0.9 %  sodium chloride infusion (has no administration in time range)  pantoprazole (PROTONIX) injection 40 mg (40 mg Intravenous Given 08/02/17 2106)    Mobility walks

## 2017-08-02 NOTE — H&P (Signed)
History and Physical    August Longest UUV:[id number] DOB: Mar 15, 1976 DOA: 08/02/2017  PCP: Patient, No Pcp Per  Patient coming from: Home  I have personally briefly reviewed patient's old medical records in [place]  Chief Complaint: Hematemesis  HPI: Spencer Cummings is a 42 y.o. male with medical history significant of MR.  Patient presents to ED after having witnessed episodes of hematemesis at group home.  First had large episode that was both BRB and coffee-ground emesis.  Then had 2 more episodes of coffee-ground emesis per group home report.  Patient tachycardic on arrival.  Cant really give much history due to baseline mental status.  Takes ASA 81 daily, no NSAID heavy use or increase, no EtOH use / abuse.   ED Course: Tachycardia to 110s.  HGB 13.  No further emesis episodes.   Review of Systems: As per HPI otherwise 10 point review of systems negative.   Past Medical History:  Diagnosis Date  . Cognitive developmental delay     Past Surgical History:  Procedure Laterality Date  . LAPAROSCOPIC CHOLECYSTECTOMY  2011     reports that he has never smoked. He does not have any smokeless tobacco history on file. He reports that he does not drink alcohol or use drugs.  No Known Allergies  Family History  Problem Relation Age of Onset  . GER disease Mother   . GER disease Father   . GI Bleed Neg Hx        Some question if patients great grandfather may have had GI Bleed, but not sure.     Prior to Admission medications   Medication Sig Start Date End Date Taking? Authorizing Provider  aspirin EC 81 MG tablet Take 325 mg by mouth daily.   Yes [provider]  cloZAPine (CLOZARIL) 100 MG tablet Take 400 mg by mouth every evening. 07/12/17  Yes [provider]    Physical Exam: Vitals:   08/02/17 1739 08/02/17 1910 08/02/17 2015  BP: 116/86 127/78 116/80  Pulse: (!) 122 (!) 115 (!) 119  Resp: 16 18 19   Temp: 98 F (36.7 C)    TempSrc:  Oral    SpO2: 95% 98% 97%    Constitutional: NAD, calm, comfortable Eyes: PERRL, lids and conjunctivae normal ENMT: Mucous membranes are moist. Posterior pharynx clear of any exudate or lesions.Normal dentition.  Neck: normal, supple, no masses, no thyromegaly Respiratory: clear to auscultation bilaterally, no wheezing, no crackles. Normal respiratory effort. No accessory muscle use.  Cardiovascular: Tachycardia Abdomen: no tenderness, no masses palpated. No hepatosplenomegaly. Bowel sounds positive.  Musculoskeletal: no clubbing / cyanosis. No joint deformity upper and lower extremities. Good ROM, no contractures. Normal muscle tone.  Skin: no rashes, lesions, ulcers. No induration Neurologic: CN 2-12 grossly intact. Sensation intact, DTR normal. Strength 5/5 in all 4.  Psychiatric: Normal judgment and insight. Alert and oriented x 3. Normal mood.    Labs on Admission: I have personally reviewed following labs and imaging studies  CBC: Recent Labs  Lab 08/02/17 1741  WBC 16.6*  HGB 13.2  HCT 39.4  MCV 90.4  PLT 474   Basic Metabolic Panel: Recent Labs  Lab 08/02/17 1741  NA 134*  K 3.4*  CL 103  CO2 25  GLUCOSE 136*  BUN 26*  CREATININE 0.83  CALCIUM 8.8*   GFR: CrCl cannot be calculated (Unknown ideal weight.). Liver Function Tests: Recent Labs  Lab 08/02/17 1741  AST 22  ALT 20  ALKPHOS 69  BILITOT 0.6  PROT 6.6  ALBUMIN 3.3*   Recent Labs  Lab 08/02/17 1741  LIPASE 27   No results for input(s): AMMONIA in the last 168 hours. Coagulation Profile: No results for input(s): INR, PROTIME in the last 168 hours. Cardiac Enzymes: No results for input(s): CKTOTAL, CKMB, CKMBINDEX, TROPONINI in the last 168 hours. BNP (last 3 results) No results for input(s): PROBNP in the last 8760 hours. HbA1C: No results for input(s): HGBA1C in the last 72 hours. CBG: No results for input(s): GLUCAP in the last 168 hours. Lipid Profile: No results for input(s):  CHOL, HDL, LDLCALC, TRIG, CHOLHDL, LDLDIRECT in the last 72 hours. Thyroid Function Tests: No results for input(s): TSH, T4TOTAL, FREET4, T3FREE, THYROIDAB in the last 72 hours. Anemia Panel: No results for input(s): VITAMINB12, FOLATE, FERRITIN, TIBC, IRON, RETICCTPCT in the last 72 hours. Urine analysis:    Component Value Date/Time   COLORURINE AMBER (A) 08/02/2017 1741   APPEARANCEUR CLEAR 08/02/2017 1741   LABSPEC 1.028 08/02/2017 1741   PHURINE 5.0 08/02/2017 1741   GLUCOSEU NEGATIVE 08/02/2017 1741   HGBUR NEGATIVE 08/02/2017 1741   BILIRUBINUR NEGATIVE 08/02/2017 1741   KETONESUR 5 (A) 08/02/2017 1741   PROTEINUR 30 (A) 08/02/2017 1741   NITRITE NEGATIVE 08/02/2017 1741   LEUKOCYTESUR NEGATIVE 08/02/2017 1741    Radiological Exams on Admission: No results found.  EKG: Independently reviewed.  Assessment/Plan Active Problems:   Hematemesis    1. Hematemesis - 1. Repeat CBC at MN and 0600 2. GI eval in AM, poss EGD 3. Clear liquids for now 4. NPO after MN 5. IVF: NS at 125 cc/hr 6. If further vomiting, obtain gastroccult. 7. Hold baby ASA 8. Tele monitor 2. MR - Chronic and baseline 1. Continue clozapine  DVT prophylaxis: SCDs Code Status: Full Family Communication: Family at bedside Disposition Plan: Home after admit Consults called: EDP calling GI Admission status: Admit to inpatient - IP status for UGIB   Jcion Buddenhagen, [facility] Pager [phone]  If 7AM-7PM, please contact day team taking care of patient [email or web address] Password [facility]  08/02/2017, 8:38 PM

## 2017-08-03 ENCOUNTER — Encounter (HOSPITAL_COMMUNITY): Payer: Self-pay | Admitting: *Deleted

## 2017-08-03 ENCOUNTER — Encounter (HOSPITAL_COMMUNITY)
Admission: EM | Disposition: A | Discharge status: Home / Self Care | Payer: Self-pay | Source: Home / Self Care | Attending: Internal Medicine

## 2017-08-03 ENCOUNTER — Other Ambulatory Visit: Payer: Self-pay

## 2017-08-03 ENCOUNTER — Inpatient Hospital Stay (HOSPITAL_COMMUNITY): Payer: Medicare Other | Admitting: Certified Registered Nurse Anesthetist

## 2017-08-03 DIAGNOSIS — D72829 Elevated white blood cell count, unspecified: Secondary | ICD-10-CM | POA: Diagnosis present

## 2017-08-03 DIAGNOSIS — E876 Hypokalemia: Secondary | ICD-10-CM | POA: Diagnosis present

## 2017-08-03 HISTORY — PX: ESOPHAGOGASTRODUODENOSCOPY: SHX5428

## 2017-08-03 LAB — CBC
HCT: 32.8 % — ABNORMAL LOW (ref 39.0–52.0)
HEMOGLOBIN: 10.7 g/dL — AB (ref 13.0–17.0)
MCH: 29.6 pg (ref 26.0–34.0)
MCHC: 32.6 g/dL (ref 30.0–36.0)
MCV: 90.6 fL (ref 78.0–100.0)
Platelets: 209 10*3/uL (ref 150–400)
RBC: 3.62 MIL/uL — ABNORMAL LOW (ref 4.22–5.81)
RDW: 14.5 % (ref 11.5–15.5)
WBC: 12.2 10*3/uL — AB (ref 4.0–10.5)

## 2017-08-03 LAB — MRSA PCR SCREENING: MRSA by PCR: NEGATIVE

## 2017-08-03 LAB — ABO/RH: ABO/RH(D): O POS

## 2017-08-03 LAB — HIV ANTIBODY (ROUTINE TESTING W REFLEX): HIV SCREEN 4TH GENERATION: NONREACTIVE

## 2017-08-03 SURGERY — EGD (ESOPHAGOGASTRODUODENOSCOPY)
Anesthesia: Monitor Anesthesia Care

## 2017-08-03 MED ORDER — LIDOCAINE 2% (20 MG/ML) 5 ML SYRINGE
INTRAMUSCULAR | Status: DC | PRN
  Administered 2017-08-03: 100 mg via INTRAVENOUS

## 2017-08-03 MED ORDER — ONDANSETRON HCL 4 MG/2ML IJ SOLN
INTRAMUSCULAR | Status: DC | PRN
  Administered 2017-08-03: 4 mg via INTRAVENOUS

## 2017-08-03 MED ORDER — SODIUM CHLORIDE 0.9 % IV SOLN: INTRAVENOUS | Status: DC

## 2017-08-03 MED ORDER — SUCRALFATE 1 GM/10ML PO SUSP
1.0000 g | Freq: Three times a day (TID) | ORAL | Status: DC
  Administered 2017-08-03 – 2017-08-06 (×12): 1 g via ORAL
  Filled 2017-08-03 (×12): qty 10

## 2017-08-03 MED ORDER — PROPOFOL 10 MG/ML IV BOLUS
INTRAVENOUS | Status: DC | PRN
  Administered 2017-08-03: 10 mg via INTRAVENOUS
  Administered 2017-08-03 (×2): 40 mg via INTRAVENOUS

## 2017-08-03 MED ORDER — PROPOFOL 10 MG/ML IV BOLUS
INTRAVENOUS | Status: AC
  Filled 2017-08-03: qty 60

## 2017-08-03 MED ORDER — PROPOFOL 500 MG/50ML IV EMUL
INTRAVENOUS | Status: DC | PRN
  Administered 2017-08-03: 125 ug/kg/min via INTRAVENOUS

## 2017-08-03 MED ORDER — PANTOPRAZOLE SODIUM 40 MG PO TBEC
40.0000 mg | DELAYED_RELEASE_TABLET | Freq: Two times a day (BID) | ORAL | Status: DC
  Administered 2017-08-03 – 2017-08-06 (×6): 40 mg via ORAL
  Filled 2017-08-03 (×6): qty 1

## 2017-08-03 MED ORDER — LACTATED RINGERS IV SOLN
INTRAVENOUS | Status: DC | PRN
  Administered 2017-08-03: 12:00:00 via INTRAVENOUS

## 2017-08-03 NOTE — Anesthesia Postprocedure Evaluation (Signed)
Anesthesia Post Note  Patient: Spencer Cummings  Procedure(s) Performed: ESOPHAGOGASTRODUODENOSCOPY (EGD) (N/A )     Patient location during evaluation: PACU Anesthesia Type: MAC Level of consciousness: awake Pain management: pain level controlled Vital Signs Assessment: post-procedure vital signs reviewed and stable Respiratory status: spontaneous breathing, nonlabored ventilation and respiratory function stable Cardiovascular status: stable and blood pressure returned to baseline Anesthetic complications: no    Last Vitals:  Vitals:   08/03/17 1230 08/03/17 1240  BP: 137/83 138/87  Pulse: (!) 102 (!) 105  Resp: 19 15  Temp:  37.2 C  SpO2: 100% 100%    Last Pain:  Vitals:   08/03/17 1240  TempSrc: Axillary  PainSc: 0-No pain                 Audry Pili

## 2017-08-03 NOTE — Anesthesia Preprocedure Evaluation (Addendum)
Anesthesia Evaluation  Patient identified by MRN, date of birth, ID band Patient awake    Reviewed: Allergy & Precautions, NPO status , Patient's Chart, lab work & pertinent test results  Airway   TM Distance: >3 FB    Comment: Limited airway exam due to patient compliance Dental  (+) Dental Advisory Given   Pulmonary neg pulmonary ROS,    Pulmonary exam normal breath sounds clear to auscultation       Cardiovascular negative cardio ROS Normal cardiovascular exam Rhythm:Regular Rate:Tachycardia     Neuro/Psych Bipolar Disorder Cognitive developmental delay    GI/Hepatic Neg liver ROS, Hematemesis   Endo/Other  Obesity  Renal/GU negative Renal ROS  negative genitourinary   Musculoskeletal negative musculoskeletal ROS (+)   Abdominal   Peds  Hematology  (+) anemia ,   Anesthesia Other Findings   Reproductive/Obstetrics                            Anesthesia Physical Anesthesia Plan  ASA: II  Anesthesia Plan: MAC   Post-op Pain Management:    Induction: Intravenous  PONV Risk Score and Plan: Propofol infusion and Treatment may vary due to age or medical condition  Airway Management Planned: Nasal Cannula and Natural Airway  Additional Equipment: None  Intra-op Plan:   Post-operative Plan: Extubation in OR  Informed Consent: I have reviewed the patients History and Physical, chart, labs and discussed the procedure including the risks, benefits and alternatives for the proposed anesthesia with the patient or authorized representative who has indicated his/her understanding and acceptance.   Dental advisory given  Plan Discussed with: CRNA and Anesthesiologist  Anesthesia Plan Comments:        Anesthesia Quick Evaluation

## 2017-08-03 NOTE — Progress Notes (Signed)
Patient ID: Spencer Cummings, male   DOB: 30-Jun-1975, 42 y.o.   MRN: [id number]  PROGRESS NOTE    Eydan Chianese  VOZ:[id number] DOB: Jun 12, 1975 DOA: 08/02/2017 PCP: Patient, No Pcp Per     Brief Narrative:  Pt is a 42 year old male with history of mental phonation from a group home who was brought in with significant episodes of hematemesis also evidence of bright red blood per rectum and coffee-ground emesis. He was hemodynamically stable except for sinus tachycardia. No identified risk factors including alcohol intake or nonsteroidals use except for aspirin 81 mg daily  Assessment & Plan:   Active Problems:   Hematemesis   #1 hematemesis: Patient had EGD today showing huge ulcers in the esophagus with 1 showing exposed vessel.patient will continue with PPIs and close monitoring. Continue according to gastroenterology  #2 mental retardation: Stable at baseline  #3 leukocytosis: Probably due to GI bleed. Continue to monitor   DVT prophylaxis: SCD  Code Status:  Full  Family Communication: None available  Disposition Plan: back to group home   Consultants:   Dr. Oletta Lamas gastroenterology  Procedures:  EGD  Antimicrobials: None  Subjective: Patient has no complaint. Status post EGD.  Objective: Vitals:   08/03/17 1217 08/03/17 1220 08/03/17 1230 08/03/17 1240  BP: 136/85 (!) 142/79 137/83 138/87  Pulse: (!) 102 (!) 101 (!) 102 (!) 105  Resp: 19 17 19 15   Temp: 98.9 F (37.2 C)   98.9 F (37.2 C)  TempSrc: Axillary   Axillary  SpO2: 100% 100% 100% 100%  Weight:      Height:        Intake/Output Summary (Last 24 hours) at 08/03/2017 1308 Last data filed at 08/03/2017 1216 Gross per 24 hour  Intake 1193.76 ml  Output -  Net 1193.76 ml   Filed Weights   08/02/17 2150  Weight: 97.6 kg (215 lb 2.7 oz)    Examination:  General exam: Appears calm and comfortable  Respiratory system: Clear to auscultation. Respiratory effort normal. Cardiovascular system: S1 &  S2 heard, RRR. No JVD, murmurs, rubs, gallops or clicks. No pedal edema. Gastrointestinal system: Abdomen is nondistended, soft and nontender. No organomegaly or masses felt. Normal bowel sounds heard. Central nervous system: Alert and oriented. No focal neurological deficits. Extremities: Symmetric 5 x 5 power. Skin: No rashes, lesions or ulcers Psychiatry: Judgement and insight appear normal. Mood & affect appropriate.     Data Reviewed: I have personally reviewed following labs and imaging studies  CBC: Recent Labs  Lab 08/02/17 1741 08/02/17 2232 08/03/17 0650  WBC 16.6*  --  12.2*  HGB 13.2 11.3* 10.7*  HCT 39.4 35.0* 32.8*  MCV 90.4  --  90.6  PLT 266  --  425   Basic Metabolic Panel: Recent Labs  Lab 08/02/17 1741  NA 134*  K 3.4*  CL 103  CO2 25  GLUCOSE 136*  BUN 26*  CREATININE 0.83  CALCIUM 8.8*   GFR: Estimated Creatinine Clearance: 134 mL/min (by C-G formula based on SCr of 0.83 mg/dL). Liver Function Tests: Recent Labs  Lab 08/02/17 1741  AST 22  ALT 20  ALKPHOS 69  BILITOT 0.6  PROT 6.6  ALBUMIN 3.3*   Recent Labs  Lab 08/02/17 1741  LIPASE 27   No results for input(s): AMMONIA in the last 168 hours. Coagulation Profile: No results for input(s): INR, PROTIME in the last 168 hours. Cardiac Enzymes: No results for input(s): CKTOTAL, CKMB, CKMBINDEX, TROPONINI in the last 168 hours. BNP (  last 3 results) No results for input(s): PROBNP in the last 8760 hours. HbA1C: No results for input(s): HGBA1C in the last 72 hours. CBG: No results for input(s): GLUCAP in the last 168 hours. Lipid Profile: No results for input(s): CHOL, HDL, LDLCALC, TRIG, CHOLHDL, LDLDIRECT in the last 72 hours. Thyroid Function Tests: No results for input(s): TSH, T4TOTAL, FREET4, T3FREE, THYROIDAB in the last 72 hours. Anemia Panel: No results for input(s): VITAMINB12, FOLATE, FERRITIN, TIBC, IRON, RETICCTPCT in the last 72 hours. Urine analysis:    Component  Value Date/Time   COLORURINE AMBER (A) 08/02/2017 1741   APPEARANCEUR CLEAR 08/02/2017 1741   LABSPEC 1.028 08/02/2017 1741   PHURINE 5.0 08/02/2017 1741   GLUCOSEU NEGATIVE 08/02/2017 1741   HGBUR NEGATIVE 08/02/2017 1741   BILIRUBINUR NEGATIVE 08/02/2017 1741   KETONESUR 5 (A) 08/02/2017 1741   PROTEINUR 30 (A) 08/02/2017 1741   NITRITE NEGATIVE 08/02/2017 1741   LEUKOCYTESUR NEGATIVE 08/02/2017 1741   Sepsis Labs: @LABRCNTIP (procalcitonin:4,lacticidven:4)  ) Recent Results (from the past 240 hour(s))  MRSA PCR Screening     Status: None   Collection Time: 08/03/17  5:48 AM  Result Value Ref Range Status   MRSA by PCR NEGATIVE NEGATIVE Final    Comment:        The GeneXpert MRSA Assay (FDA approved for NASAL specimens only), is one component of a comprehensive MRSA colonization surveillance program. It is not intended to diagnose MRSA infection nor to guide or monitor treatment for MRSA infections. Performed at [facility], [place] [address] [place], [place] [address]          Radiology Studies: No results found.      Scheduled Meds: . [MAR Hold] cloZAPine  400 mg Oral QPM   Continuous Infusions: . sodium chloride 125 mL/hr at 08/03/17 0112  . sodium chloride       LOS: 1 day    Time spent: 36 minutes    Lydiann Bonifas,LAWAL, MD [facility] Pager [phone] If 7PM-7AM, please contact night-coverage [email or web address] Password [email or web address] 08/03/2017, 1:08 PM

## 2017-08-03 NOTE — Op Note (Signed)
[facility] Patient Name: Spencer Cummings Procedure Date: 08/03/2017 MRN: [id number] Attending MD: Nancy Fetter Dr., MD Date of Birth: 09-25-1975 CSN: [id number] Age: 42 Admit Type: Inpatient Procedure:                Upper GI endoscopy Indications:              Hematemesis Providers:                Jeneen Rinks L. Brihany Butch Dr., MD, Cleda Daub, RN, Truddie Coco, RN Referring MD:              Medicines:                Monitored Anesthesia Care Complications:            No immediate complications. Estimated Blood Loss:     Estimated blood loss: none. Procedure:                Pre-Anesthesia Assessment:                           - Prior to the procedure, a History and Physical                            was performed, and patient medications and                            allergies were reviewed. The patient's tolerance of                            previous anesthesia was also reviewed. The risks                            and benefits of the procedure and the sedation                            options and risks were discussed with the patient.                            All questions were answered, and informed consent                            was obtained. Prior Anticoagulants: The patient has                            taken no previous anticoagulant or antiplatelet                            agents. ASA Grade Assessment: II - A patient with                            mild systemic disease. After reviewing the risks  and benefits, the patient was deemed in                            satisfactory condition to undergo the procedure.                           After obtaining informed consent, the endoscope was                            passed under direct vision. Throughout the                            procedure, the patient's blood pressure, pulse, and                            oxygen saturations were monitored  continuously. The                            EG-[id number] ([id number]) scope was introduced through the                            mouth, and advanced to the second part of duodenum.                            The upper GI endoscopy was accomplished without                            difficulty. The patient tolerated the procedure                            well. Scope In: Scope Out: Findings:      Severe esophagitis with no bleeding was found 25 to 35 cm from the       incisors. There were multiple shallow ulcerations that extended       circumferentially in the lower esophagus for a 10 cm length. There was       one vessel that was not actively bleeding. Therefore no injections or       clippings were performed.      A medium-sized hiatal hernia was present.      The examined duodenum was normal.      The stomach was normal. Impression:               - Severe reflux esophagitis. Visible vessel with                            clot present but no active bleeding.                           - Medium-sized hiatal hernia.                           - Normal examined duodenum.                           - Normal stomach.                           -  No specimens collected. Moderate Sedation:      See anesthesia note, no moderate sedation. Recommendation:           - Clear liquid diet.                           - Use Protonix (pantoprazole) 20 mg PO BID.                           - Use sucralfate suspension 1 gram PO QID.                           - Repeat upper endoscopy in 2 months to check                            healing. Procedure Code(s):        --- Professional ---                           [phone], Esophagogastroduodenoscopy, flexible,                            transoral; diagnostic, including collection of                            specimen(s) by brushing or washing, when performed                            (separate procedure) Diagnosis Code(s):        --- Professional ---                            K21.0, Gastro-esophageal reflux disease with                            esophagitis                           K44.9, Diaphragmatic hernia without obstruction or                            gangrene                           K92.0, Hematemesis CPT copyright 2017 American Medical Association. All rights reserved. The codes documented in this report are preliminary and upon coder review may  be revised to meet current compliance requirements. Nancy Fetter Dr., MD 08/03/2017 12:20:31 PM This report has been signed electronically. Number of Addenda: 0

## 2017-08-03 NOTE — Anesthesia Procedure Notes (Signed)
Procedure Name: MAC Date/Time: 08/03/2017 11:54 AM Performed by: West Pugh, CRNA Pre-anesthesia Checklist: Patient identified, Emergency Drugs available, Suction available, Patient being monitored and Timeout performed Patient Re-evaluated:Patient Re-evaluated prior to induction Oxygen Delivery Method: Nasal cannula Placement Confirmation: CO2 detector and positive ETCO2 Dental Injury: Teeth and Oropharynx as per pre-operative assessment

## 2017-08-03 NOTE — Interval H&P Note (Signed)
History and Physical Interval Note:  08/03/2017 11:43 AM  Spencer Cummings  has presented today for surgery, with the diagnosis of hematemesis  The various methods of treatment have been discussed with the patient and family. After consideration of risks, benefits and other options for treatment, the patient has consented to  Procedure(s): ESOPHAGOGASTRODUODENOSCOPY (EGD) (N/A) as a surgical intervention .  The patient's history has been reviewed, patient examined, no change in status, stable for surgery.  I have reviewed the patient's chart and labs.  Questions were answered to the patient's satisfaction.     Nancy Fetter

## 2017-08-03 NOTE — H&P (View-Only) (Signed)
[facility] Reason for consult: Hematemesis Referring Physician: [facility] hospitalist.  PCP: Group home Dr. primary GI: None  Spencer Cummings is an 42 y.o. male.  HPI: He has a history of mental retardation and bipolar disease altered mental status and is in a group home.  He is somewhat functional but most of the history is obtained from his parents who are in the room today.  He apparently vomited up a large quantity of bright blood and coffee-ground material followed by coffee-ground material on 2 other occasions at the group home and was brought toThe emergency room.  Initial hemoglobin was 13.2 down to 10.7 this morning.  The patient has never had ulcers and is a nondrinker.  He has not taken any NSAIDs.  His parents have had screening colonoscopies but there is no family history of upper GI cancer.  Past Medical History:  Diagnosis Date  . Bipolar disorder ([place])   . Cognitive developmental delay     Past Surgical History:  Procedure Laterality Date  . LAPAROSCOPIC CHOLECYSTECTOMY  2011    Family History  Problem Relation Age of Onset  . GER disease Mother   . GER disease Father   . GI Bleed Neg Hx        Some question if patients great grandfather may have had GI Bleed, but not sure.    Social History:  reports that he has never smoked. He has never used smokeless tobacco. He reports that he does not drink alcohol or use drugs.  Allergies:  Allergies  Allergen Reactions  . Lamictal [Lamotrigine] Rash    Tongue thrush, burning, swelling, elbow eczema, sores on knee, finger, wrist, shinn and foot in 2004  . Mellaril [Thioridazine] Other (See Comments)    Neuroleptic malignant syndrome  . Zoloft [Sertraline Hcl] Other (See Comments)    Uncontrolled salvia, weak, limber - occurred very quickly after getting this medication    Medications; Prior to Admission medications   Medication Sig Start Date End Date Taking? Authorizing Provider  aspirin EC 81 MG tablet  Take 325 mg by mouth daily.   Yes [provider]  cloZAPine (CLOZARIL) 100 MG tablet Take 400 mg by mouth every evening. 07/12/17  Yes [provider]   . cloZAPine  400 mg Oral QPM   PRN Meds acetaminophen **OR** acetaminophen, ondansetron **OR** ondansetron (ZOFRAN) IV Results for orders placed or performed during the hospital encounter of 08/02/17 (from the past 48 hour(s))  Lipase, blood     Status: None   Collection Time: 08/02/17  5:41 PM  Result Value Ref Range   Lipase 27 11 - 51 U/L    Comment: Performed at [facility], [place] [address] [place], [place] [address]  Comprehensive metabolic panel     Status: Abnormal   Collection Time: 08/02/17  5:41 PM  Result Value Ref Range   Sodium 134 (L) 135 - 145 mmol/L   Potassium 3.4 (L) 3.5 - 5.1 mmol/L   Chloride 103 101 - 111 mmol/L   CO2 25 22 - 32 mmol/L   Glucose, Bld 136 (H) 65 - 99 mg/dL   BUN 26 (H) 6 - 20 mg/dL   Creatinine, Ser 0.83 0.61 - 1.24 mg/dL   Calcium 8.8 (L) 8.9 - 10.3 mg/dL   Total Protein 6.6 6.5 - 8.1 g/dL   Albumin 3.3 (L) 3.5 - 5.0 g/dL   AST 22 15 - 41 U/L   ALT 20 17 - 63 U/L   Alkaline  Phosphatase 69 38 - 126 U/L   Total Bilirubin 0.6 0.3 - 1.2 mg/dL   GFR calc non Af Amer >60 >60 mL/min   GFR calc Af Amer >60 >60 mL/min    Comment: (NOTE) The eGFR has been calculated using the CKD EPI equation. This calculation has not been validated in all clinical situations. eGFR's persistently <60 mL/min signify possible Chronic Kidney Disease.    Anion gap 6 5 - 15    Comment: Performed at [facility], [place] [address], [place], [place] [address]  CBC     Status: Abnormal   Collection Time: 08/02/17  5:41 PM  Result Value Ref Range   WBC 16.6 (H) 4.0 - 10.5 K/uL   RBC 4.36 4.22 - 5.81 MIL/uL   Hemoglobin 13.2 13.0 - 17.0 g/dL   HCT 39.4 39.0 - 52.0 %   MCV 90.4 78.0 - 100.0 fL   MCH 30.3 26.0 - 34.0 pg   MCHC 33.5 30.0 - 36.0 g/dL   RDW 14.5 11.5  - 15.5 %   Platelets 266 150 - 400 K/uL    Comment: Performed at [facility], [place] [address], [place], [place] [address]  Urinalysis, Routine w reflex microscopic     Status: Abnormal   Collection Time: 08/02/17  5:41 PM  Result Value Ref Range   Color, Urine AMBER (A) YELLOW    Comment: BIOCHEMICALS MAY BE AFFECTED BY COLOR   APPearance CLEAR CLEAR   Specific Gravity, Urine 1.028 1.005 - 1.030   pH 5.0 5.0 - 8.0   Glucose, UA NEGATIVE NEGATIVE mg/dL   Hgb urine dipstick NEGATIVE NEGATIVE   Bilirubin Urine NEGATIVE NEGATIVE   Ketones, ur 5 (A) NEGATIVE mg/dL   Protein, ur 30 (A) NEGATIVE mg/dL   Nitrite NEGATIVE NEGATIVE   Leukocytes, UA NEGATIVE NEGATIVE   RBC / HPF 0-5 0 - 5 RBC/hpf   WBC, UA 0-5 0 - 5 WBC/hpf   Bacteria, UA RARE (A) NONE SEEN   Squamous Epithelial / LPF NONE SEEN NONE SEEN   Mucus PRESENT    Sperm, UA PRESENT     Comment: Performed at [facility], [place] [address], [place], [place] [address]  ABO/Rh     Status: None   Collection Time: 08/02/17 10:27 PM  Result Value Ref Range   ABO/RH(D)      O POS Performed at [facility], [place] [address] [place], [place] [address]   Type and screen [place]     Status: None   Collection Time: 08/02/17 10:32 PM  Result Value Ref Range   ABO/RH(D) O POS    Antibody Screen NEG    Sample Expiration      08/05/2017 Performed at [facility], [place] [address], [place], [place] [address]   Hemoglobin and hematocrit, blood     Status: Abnormal   Collection Time: 08/02/17 10:32 PM  Result Value Ref Range   Hemoglobin 11.3 (L) 13.0 - 17.0 g/dL   HCT 35.0 (L) 39.0 - 52.0 %    Comment: Performed at [facility], [place] [address] [place], [place] [address]  MRSA PCR Screening     Status: None   Collection Time: 08/03/17  5:48 AM  Result Value Ref Range   MRSA by PCR NEGATIVE NEGATIVE    Comment:        The  GeneXpert MRSA Assay (FDA approved for NASAL specimens only), is one component  of a comprehensive MRSA colonization surveillance program. It is not intended to diagnose MRSA infection nor to guide or monitor treatment for MRSA infections. Performed at [facility], [place] [address], [place], [place] [address]   CBC     Status: Abnormal   Collection Time: 08/03/17  6:50 AM  Result Value Ref Range   WBC 12.2 (H) 4.0 - 10.5 K/uL   RBC 3.62 (L) 4.22 - 5.81 MIL/uL   Hemoglobin 10.7 (L) 13.0 - 17.0 g/dL   HCT 32.8 (L) 39.0 - 52.0 %   MCV 90.6 78.0 - 100.0 fL   MCH 29.6 26.0 - 34.0 pg   MCHC 32.6 30.0 - 36.0 g/dL   RDW 14.5 11.5 - 15.5 %   Platelets 209 150 - 400 K/uL    Comment: Performed at [facility], [place] [address] [place], [place] [address]    No results found. ROS: As above.  Was obtained from parents            Blood pressure 117/75, pulse (!) 106, temperature 98.3 F (36.8 C), temperature source Oral, resp. rate 18, height _0  (1.727 m), weight 97.6 kg (215 lb 2.7 oz), SpO2 97 %.  Physical exam:   General--Withdrawn white male ENT--Nonicteric Neck-- Heart--Regular rate and rhythm without murmurs or gallops Lungs--Clear Abdomen--Soft and nontender Psych--Minimally responsive   Assessment: 1. Hematemesis.  Long discussion with parents this could be due to almost anything.  They have had colonoscopies and EGDs and are willing to have him undergo these procedures.  Plan: EGD planned later today have discussed in detail with parents.   Nancy Fetter 08/03/2017, 8:39 AM   This note was created using voice recognition software and minor errors may Have occurred unintentionally. Pager: [phone] If no answer or after hours call [phone]

## 2017-08-03 NOTE — Transfer of Care (Signed)
Immediate Anesthesia Transfer of Care Note  Patient: Spencer Cummings  Procedure(s) Performed: ESOPHAGOGASTRODUODENOSCOPY (EGD) (N/A )  Patient Location: PACU  Anesthesia Type:MAC  Level of Consciousness: drowsy and patient cooperative  Airway & Oxygen Therapy: Patient Spontanous Breathing and Patient connected to nasal cannula oxygen  Post-op Assessment: Report given to RN and Post -op Vital signs reviewed and stable  Post vital signs: Reviewed and stable  Last Vitals:  Vitals Value Taken Time  BP    Temp    Pulse 102 08/03/2017 12:16 PM  Resp 19 08/03/2017 12:16 PM  SpO2 100 % 08/03/2017 12:16 PM  Vitals shown include unvalidated device data.  Last Pain:  Vitals:   08/03/17 0549  TempSrc: Oral         Complications: No apparent anesthesia complications

## 2017-08-03 NOTE — Consult Note (Signed)
[facility] Reason for consult: Hematemesis Referring Physician: [facility] hospitalist.  PCP: Group home Dr. primary GI: None  Spencer Cummings is an 41 y.o. male.  HPI: He has a history of mental retardation and bipolar disease altered mental status and is in a group home.  He is somewhat functional but most of the history is obtained from his parents who are in the room today.  He apparently vomited up a large quantity of bright blood and coffee-ground material followed by coffee-ground material on 2 other occasions at the group home and was brought toThe emergency room.  Initial hemoglobin was 13.2 down to 10.7 this morning.  The patient has never had ulcers and is a nondrinker.  He has not taken any NSAIDs.  His parents have had screening colonoscopies but there is no family history of upper GI cancer.  Past Medical History:  Diagnosis Date  . Bipolar disorder ([facility])   . Cognitive developmental delay     Past Surgical History:  Procedure Laterality Date  . LAPAROSCOPIC CHOLECYSTECTOMY  2011    Family History  Problem Relation Age of Onset  . GER disease Mother   . GER disease Father   . GI Bleed Neg Hx        Some question if patients great grandfather may have had GI Bleed, but not sure.    Social History:  reports that he has never smoked. He has never used smokeless tobacco. He reports that he does not drink alcohol or use drugs.  Allergies:  Allergies  Allergen Reactions  . Lamictal [Lamotrigine] Rash    Tongue thrush, burning, swelling, elbow eczema, sores on knee, finger, wrist, shinn and foot in 2004  . Mellaril [Thioridazine] Other (See Comments)    Neuroleptic malignant syndrome  . Zoloft [Sertraline Hcl] Other (See Comments)    Uncontrolled salvia, weak, limber - occurred very quickly after getting this medication    Medications; Prior to Admission medications   Medication Sig Start Date End Date Taking? Authorizing Provider  aspirin EC 81 MG tablet  Take 325 mg by mouth daily.   Yes [provider]  cloZAPine (CLOZARIL) 100 MG tablet Take 400 mg by mouth every evening. 07/12/17  Yes [provider]   . cloZAPine  400 mg Oral QPM   PRN Meds acetaminophen **OR** acetaminophen, ondansetron **OR** ondansetron (ZOFRAN) IV Results for orders placed or performed during the hospital encounter of 08/02/17 (from the past 48 hour(s))  Lipase, blood     Status: None   Collection Time: 08/02/17  5:41 PM  Result Value Ref Range   Lipase 27 11 - 51 U/L    Comment: Performed at [facility], [address] [place], [place] [address]  Comprehensive metabolic panel     Status: Abnormal   Collection Time: 08/02/17  5:41 PM  Result Value Ref Range   Sodium 134 (L) 135 - 145 mmol/L   Potassium 3.4 (L) 3.5 - 5.1 mmol/L   Chloride 103 101 - 111 mmol/L   CO2 25 22 - 32 mmol/L   Glucose, Bld 136 (H) 65 - 99 mg/dL   BUN 26 (H) 6 - 20 mg/dL   Creatinine, Ser 0.83 0.61 - 1.24 mg/dL   Calcium 8.8 (L) 8.9 - 10.3 mg/dL   Total Protein 6.6 6.5 - 8.1 g/dL   Albumin 3.3 (L) 3.5 - 5.0 g/dL   AST 22 15 - 41 U/L   ALT 20 17 - 63 U/L   Alkaline   Phosphatase 69 38 - 126 U/L   Total Bilirubin 0.6 0.3 - 1.2 mg/dL   GFR calc non Af Amer >60 >60 mL/min   GFR calc Af Amer >60 >60 mL/min    Comment: (NOTE) The eGFR has been calculated using the CKD EPI equation. This calculation has not been validated in all clinical situations. eGFR's persistently <60 mL/min signify possible Chronic Kidney Disease.    Anion gap 6 5 - 15    Comment: Performed at [facility], [address] [place], [place] [address]  CBC     Status: Abnormal   Collection Time: 08/02/17  5:41 PM  Result Value Ref Range   WBC 16.6 (H) 4.0 - 10.5 K/uL   RBC 4.36 4.22 - 5.81 MIL/uL   Hemoglobin 13.2 13.0 - 17.0 g/dL   HCT 39.4 39.0 - 52.0 %   MCV 90.4 78.0 - 100.0 fL   MCH 30.3 26.0 - 34.0 pg   MCHC 33.5 30.0 - 36.0 g/dL   RDW 14.5 11.5  - 15.5 %   Platelets 266 150 - 400 K/uL    Comment: Performed at [facility], [address] [place], [place] [address]  Urinalysis, Routine w reflex microscopic     Status: Abnormal   Collection Time: 08/02/17  5:41 PM  Result Value Ref Range   Color, Urine AMBER (A) YELLOW    Comment: BIOCHEMICALS MAY BE AFFECTED BY COLOR   APPearance CLEAR CLEAR   Specific Gravity, Urine 1.028 1.005 - 1.030   pH 5.0 5.0 - 8.0   Glucose, UA NEGATIVE NEGATIVE mg/dL   Hgb urine dipstick NEGATIVE NEGATIVE   Bilirubin Urine NEGATIVE NEGATIVE   Ketones, ur 5 (A) NEGATIVE mg/dL   Protein, ur 30 (A) NEGATIVE mg/dL   Nitrite NEGATIVE NEGATIVE   Leukocytes, UA NEGATIVE NEGATIVE   RBC / HPF 0-5 0 - 5 RBC/hpf   WBC, UA 0-5 0 - 5 WBC/hpf   Bacteria, UA RARE (A) NONE SEEN   Squamous Epithelial / LPF NONE SEEN NONE SEEN   Mucus PRESENT    Sperm, UA PRESENT     Comment: Performed at [facility], [address] [place], [place] [address]  ABO/Rh     Status: None   Collection Time: 08/02/17 10:27 PM  Result Value Ref Range   ABO/RH(D)      O POS Performed at [facility], [address] [place], [place] [address]   Type and screen [facility]     Status: None   Collection Time: 08/02/17 10:32 PM  Result Value Ref Range   ABO/RH(D) O POS    Antibody Screen NEG    Sample Expiration      08/05/2017 Performed at [facility], [address] [place], [place] [address]   Hemoglobin and hematocrit, blood     Status: Abnormal   Collection Time: 08/02/17 10:32 PM  Result Value Ref Range   Hemoglobin 11.3 (L) 13.0 - 17.0 g/dL   HCT 35.0 (L) 39.0 - 52.0 %    Comment: Performed at [facility], [address] [place], [place] [address]  MRSA PCR Screening     Status: None   Collection Time: 08/03/17  5:48 AM  Result Value Ref Range   MRSA by PCR NEGATIVE NEGATIVE    Comment:        The  GeneXpert MRSA Assay (FDA approved for NASAL specimens only), is one component   of a comprehensive MRSA colonization surveillance program. It is not intended to diagnose MRSA infection nor to guide or monitor treatment for MRSA infections. Performed at [facility], [address] [place], [place] [address]   CBC     Status: Abnormal   Collection Time: 08/03/17  6:50 AM  Result Value Ref Range   WBC 12.2 (H) 4.0 - 10.5 K/uL   RBC 3.62 (L) 4.22 - 5.81 MIL/uL   Hemoglobin 10.7 (L) 13.0 - 17.0 g/dL   HCT 32.8 (L) 39.0 - 52.0 %   MCV 90.6 78.0 - 100.0 fL   MCH 29.6 26.0 - 34.0 pg   MCHC 32.6 30.0 - 36.0 g/dL   RDW 14.5 11.5 - 15.5 %   Platelets 209 150 - 400 K/uL    Comment: Performed at [facility], [address] [place], [place] [address]    No results found. ROS: As above.  Was obtained from parents            Blood pressure 117/75, pulse (!) 106, temperature 98.3 F (36.8 C), temperature source Oral, resp. rate 18, height 5' 8" (1.727 m), weight 97.6 kg (215 lb 2.7 oz), SpO2 97 %.  Physical exam:   General--Withdrawn white male ENT--Nonicteric Neck-- Heart--Regular rate and rhythm without murmurs or gallops Lungs--Clear Abdomen--Soft and nontender Psych--Minimally responsive   Assessment: 1. Hematemesis.  Long discussion with parents this could be due to almost anything.  They have had colonoscopies and EGDs and are willing to have him undergo these procedures.  Plan: EGD planned later today have discussed in detail with parents.   Greggory Safranek L Claris Pech 08/03/2017, 8:39 AM   This note was created using voice recognition software and minor errors may Have occurred unintentionally. Pager: [phone] If no answer or after hours call [phone]    

## 2017-08-04 ENCOUNTER — Other Ambulatory Visit: Payer: Self-pay

## 2017-08-04 ENCOUNTER — Encounter (HOSPITAL_COMMUNITY): Payer: Self-pay | Admitting: Gastroenterology

## 2017-08-04 DIAGNOSIS — K922 Gastrointestinal hemorrhage, unspecified: Secondary | ICD-10-CM

## 2017-08-04 DIAGNOSIS — D62 Acute posthemorrhagic anemia: Secondary | ICD-10-CM

## 2017-08-04 LAB — CBC
HCT: 33.5 % — ABNORMAL LOW (ref 39.0–52.0)
Hemoglobin: 10.9 g/dL — ABNORMAL LOW (ref 13.0–17.0)
MCH: 29.8 pg (ref 26.0–34.0)
MCHC: 32.5 g/dL (ref 30.0–36.0)
MCV: 91.5 fL (ref 78.0–100.0)
PLATELETS: 227 10*3/uL (ref 150–400)
RBC: 3.66 MIL/uL — AB (ref 4.22–5.81)
RDW: 15.1 % (ref 11.5–15.5)
WBC: 10.2 10*3/uL (ref 4.0–10.5)

## 2017-08-04 NOTE — Progress Notes (Signed)
[facility] PROGRESS NOTE  Spencer Cummings UXN:[id number] DOB: [birth year] DOA: 08/02/2017  PCP: Patient, No Pcp Per  Brief History/Interval Summary: 42 year old Caucasian male with a history of mental retardation who lives in a group home brought in with episodes of hematemesis and also mention of bright red blood per rectum.  Patient was hospitalized for further management.  Reason for Visit: GI bleed  Consultants: [facility]  Procedures:  EGD Impression:                - Severe reflux esophagitis. Visible vessel with clot present but no active bleeding.  - Medium-sized hiatal hernia.  - Normal examined duodenum.  Normal stomach. - No specimens collected.  Antibiotics: None  Subjective/Interval History: Difficult to obtain history from patient due to his mental retardation.  His parents are at the bedside.  He seems to be appropriate and at baseline according to them.  ROS: Unable to do  Objective:  Vital Signs  Vitals:   08/03/17 2013 08/04/17 0451 08/04/17 0826 08/04/17 1217  BP: 121/74 102/61 (!) 86/47 140/84  Pulse: (!) 111 (!) 117 (!) 111 (!) 116  Resp: 16 15 16    Temp: 98.1 F (36.7 C) 98.5 F (36.9 C) 98.7 F (37.1 C) 98.6 F (37 C)  TempSrc: Oral Axillary Axillary Axillary  SpO2: 98% 98% 99% 97%  Weight:      Height:        Intake/Output Summary (Last 24 hours) at 08/04/2017 1309 Last data filed at 08/04/2017 0200 Gross per 24 hour  Intake 2855 ml  Output -  Net 2855 ml   Filed Weights   08/02/17 2150  Weight: 97.6 kg (215 lb 2.7 oz)    General appearance: alert, distracted and no distress Head: Normocephalic, without obvious abnormality, atraumatic Resp: clear to auscultation bilaterally Cardio: regular rate and rhythm, S1, S2 normal, no murmur, click, rub or gallop GI: soft, non-tender; bowel sounds normal; no masses,  no organomegaly Extremities: extremities normal, atraumatic, no cyanosis or edema Neurologic: Patient has  mental retardation.  No obvious deficits noted otherwise  Lab Results:  Data Reviewed: I have personally reviewed following labs and imaging studies  CBC: Recent Labs  Lab 08/02/17 1741 08/02/17 2232 08/03/17 0650 08/04/17 1038  WBC 16.6*  --  12.2* 10.2  HGB 13.2 11.3* 10.7* 10.9*  HCT 39.4 35.0* 32.8* 33.5*  MCV 90.4  --  90.6 91.5  PLT 266  --  209 254    Basic Metabolic Panel: Recent Labs  Lab 08/02/17 1741  NA 134*  K 3.4*  CL 103  CO2 25  GLUCOSE 136*  BUN 26*  CREATININE 0.83  CALCIUM 8.8*    GFR: Estimated Creatinine Clearance: 134 mL/min (by C-G formula based on SCr of 0.83 mg/dL).  Liver Function Tests: Recent Labs  Lab 08/02/17 1741  AST 22  ALT 20  ALKPHOS 69  BILITOT 0.6  PROT 6.6  ALBUMIN 3.3*    Recent Labs  Lab 08/02/17 1741  LIPASE 27    Recent Results (from the past 240 hour(s))  MRSA PCR Screening     Status: None   Collection Time: 08/03/17  5:48 AM  Result Value Ref Range Status   MRSA by PCR NEGATIVE NEGATIVE Final    Comment:        The GeneXpert MRSA Assay (FDA approved for NASAL specimens only), is one component of a comprehensive MRSA colonization surveillance program. It is not intended to diagnose MRSA infection nor to guide  or monitor treatment for MRSA infections. Performed at [facility], [place] [address] [place], [place] [address]       Radiology Studies: No results found.   Medications:  Scheduled: . cloZAPine  400 mg Oral QPM  . pantoprazole  40 mg Oral BID  . sucralfate  1 g Oral TID WC & HS   Continuous: . sodium chloride 50 mL/hr at 08/04/17 1236   AJO:INOMVEHMCNOBS **OR** acetaminophen, ondansetron **OR** ondansetron (ZOFRAN) IV  Assessment/Plan:  Active Problems:   Hematemesis   Hypokalemia   Leucocytosis    GI bleed, thought to be upper EGD showed severe ulcerative esophagitis.  No active bleeding was noted.  Patient is on PPI and Carafate which will be  continued.  Hemoglobin has been stable.  Follow-up with GI in 6 weeks and repeat EGD in 2 months.  Hold aspirin for now which he takes for preventive purposes only.    Acute blood loss anemia Mild drop in hemoglobin noted.  Stable this morning.  Continue to monitor.  Sinus tachycardia Seems to be asymptomatic.  Stable.  History of mental retardation Stable and at baseline.  Leukocytosis Possibly reactive due to acute illness.  Afebrile.  Normal today.  No infectious source is identified.   DVT Prophylaxis: SCDs    Code Status: Full code Family Communication: Discussed with the patient's parents Disposition Plan: Management as outlined above.  Anticipate discharge tomorrow.    LOS: 2 days   [facility] Pager [phone] 08/04/2017, 1:09 PM  If 7PM-7AM, please contact night-coverage at [email or web address], password [facility]

## 2017-08-04 NOTE — Progress Notes (Signed)
Pt has had several formed, "rock hard stools" coffee ground and brown mixed in color. PO Liquids encouraged. SRP, RN

## 2017-08-04 NOTE — Progress Notes (Signed)
[facility] PROGRESS NOTE Subjective Patient had a quiet night with no signs of any bleeding.  According to the parents he tolerated full liquids well.  Objective: Vital signs in last 24 hours: Temp:  [97.9 F (36.6 C)-98.9 F (37.2 C)] 98.5 F (36.9 C) (04/04 0451) Pulse Rate:  [101-117] 117 (04/04 0451) Resp:  [15-19] 15 (04/04 0451) BP: (102-142)/(61-87) 102/61 (04/04 0451) SpO2:  [98 %-100 %] 98 % (04/04 0451)    Intake/Output from previous day: 04/03 0701 - 04/04 0700 In: 3323.8 [P.O.:480; I.V.:2843.8] Out: -  Intake/Output this shift: No intake/output data recorded.   Lab Results: Recent Labs    08/02/17 1741 08/02/17 2232 08/03/17 0650  WBC 16.6*  --  12.2*  HGB 13.2 11.3* 10.7*  HCT 39.4 35.0* 32.8*  PLT 266  --  209   BMET Recent Labs    08/02/17 1741  NA 134*  K 3.4*  CL 103  CO2 25  CREATININE 0.83   LFT Recent Labs    08/02/17 1741  PROT 6.6  AST 22  ALT 20  ALKPHOS 69  BILITOT 0.6   PT/INR No results for input(s): LABPROT, INR in the last 72 hours. PANCREAS Recent Labs    08/02/17 1741  LIPASE 27         Studies/Results: No results found.  Medications: I have reviewed the patient's current medications.  Assessment:   1.  Severe ulcerative esophagitis.  Patient at some increased risk due to a vessel with clot in 1 of the ulcers.  No active bleeding at the time of EGD.  He is on Carafate suspension and PPI twice daily.   Plan: 1.  I would continue to watch him 1 more day since due to his mental retardation and residents in a group home he could develop some bleeding that no one may notice.  If his hemoglobin is stable tomorrow I would send him home on the following:  --Protonix 40 mg p.o. twice daily --Carafate suspension PC and at bedtime for 1 week only and then he could stop it. I have asked the parents to make an appointment to see me in around 6 weeks and we will make arrangements to repeat EGD in 2 months to  make certain that the severe ulcerations in the esophagus have healed and that there is no underlying CA   Nancy Fetter 08/04/2017, 7:18 AM  This note was created using voice recognition software. Minor errors may Have occurred unintentionally.  Pager: [phone] If no answer or after hours call [phone]

## 2017-08-05 DIAGNOSIS — K209 Esophagitis, unspecified: Secondary | ICD-10-CM

## 2017-08-05 LAB — CBC
HCT: 30.4 % — ABNORMAL LOW (ref 39.0–52.0)
HEMATOCRIT: 32.4 % — AB (ref 39.0–52.0)
HEMOGLOBIN: 9.7 g/dL — AB (ref 13.0–17.0)
HEMOGLOBIN: 10.5 g/dL — AB (ref 13.0–17.0)
MCH: 29.8 pg (ref 26.0–34.0)
MCH: 29.7 pg (ref 26.0–34.0)
MCHC: 31.9 g/dL (ref 30.0–36.0)
MCHC: 32.4 g/dL (ref 30.0–36.0)
MCV: 93.3 fL (ref 78.0–100.0)
MCV: 91.8 fL (ref 78.0–100.0)
Platelets: 243 10*3/uL (ref 150–400)
Platelets: 250 10*3/uL (ref 150–400)
RBC: 3.26 MIL/uL — AB (ref 4.22–5.81)
RBC: 3.53 MIL/uL — AB (ref 4.22–5.81)
RDW: 15.4 % (ref 11.5–15.5)
RDW: 15.2 % (ref 11.5–15.5)
WBC: 9.8 10*3/uL (ref 4.0–10.5)
WBC: 10.7 10*3/uL — AB (ref 4.0–10.5)

## 2017-08-05 LAB — BASIC METABOLIC PANEL
ANION GAP: 6 (ref 5–15)
BUN: 7 mg/dL (ref 6–20)
CHLORIDE: 110 mmol/L (ref 101–111)
CO2: 25 mmol/L (ref 22–32)
Calcium: 8.4 mg/dL — ABNORMAL LOW (ref 8.9–10.3)
Creatinine, Ser: 0.8 mg/dL (ref 0.61–1.24)
GFR calc non Af Amer: >60 mL/min (ref >60)
Glucose, Bld: 108 mg/dL — ABNORMAL HIGH (ref 65–99)
POTASSIUM: 3.7 mmol/L (ref 3.5–5.1)
SODIUM: 141 mmol/L (ref 135–145)

## 2017-08-05 MED ORDER — POLYETHYLENE GLYCOL 3350 17 G PO PACK
17.0000 g | PACK | Freq: Every day | ORAL | Status: DC
  Administered 2017-08-05: 17 g via ORAL
  Filled 2017-08-05 (×3): qty 1

## 2017-08-05 NOTE — Care Management Important Message (Signed)
Important Message  Patient Details  Name: Manpreet Strey MRN: [id number] Date of Birth: 02-22-1976   Medicare Important Message Given:  Yes    Kerin Salen 08/05/2017, 11:33 AMImportant Message  Patient Details  Name: Quran Vasco MRN: [id number] Date of Birth: [birth year]   Medicare Important Message Given:  Yes    Kerin Salen 08/05/2017, 11:33 AM

## 2017-08-05 NOTE — Progress Notes (Signed)
[facility] PROGRESS NOTE  Spencer Cummings HEN:[id number] DOB: 12/14/1975 DOA: 08/02/2017  PCP: Lawerance Cruel, MD  Brief History/Interval Summary: 42 year old Caucasian male with a history of mental retardation who lives in a group home brought in with episodes of hematemesis and also mention of bright red blood per rectum.  Patient was hospitalized for further management.  Patient underwent EGD which showed severe reflux esophagitis.  Reason for Visit: GI bleed  Consultants: [facility]  Procedures:  EGD Impression:                - Severe reflux esophagitis. Visible vessel with clot present but no active bleeding.  - Medium-sized hiatal hernia.  - Normal examined duodenum.  Normal stomach. - No specimens collected.  Antibiotics: None  Subjective/Interval History: Patient unable to provide any history due to his mental retardation.  His parents are at the bedside.  He did have hard stools yesterday which were brown mixed with black stool.  No bright red blood was noted.  ROS: Unable to do  Objective:  Vital Signs  Vitals:   08/04/17 0826 08/04/17 1217 08/04/17 2037 08/05/17 0635  BP: (!) 86/47 140/84 134/80 (!) 104/59  Pulse: (!) 111 (!) 116 (!) 113 (!) 101  Resp: 16  16 18   Temp: 98.7 F (37.1 C) 98.6 F (37 C) 98.8 F (37.1 C) 98.5 F (36.9 C)  TempSrc: Axillary Axillary Oral Oral  SpO2: 99% 97% 98% 99%  Weight:      Height:        Intake/Output Summary (Last 24 hours) at 08/05/2017 0924 Last data filed at 08/05/2017 0437 Gross per 24 hour  Intake 1919.58 ml  Output -  Net 1919.58 ml   Filed Weights   08/02/17 2150  Weight: 97.6 kg (215 lb 2.7 oz)    General appearance: Alert.  Distracted.  In no distress Resp: Clear to auscultation bilaterally Cardio: S1-S2 is normal regular.  No S3-S4.  No rubs murmurs or bruit GI: Soft.  Nontender nondistended.  Bowel sounds are present.  No masses organomegaly Extremities: No edema Neurologic:  Patient has mental retardation.  No obvious deficits noted otherwise  Lab Results:  Data Reviewed: I have personally reviewed following labs and imaging studies  CBC: Recent Labs  Lab 08/02/17 1741 08/02/17 2232 08/03/17 0650 08/04/17 1038 08/05/17 0409  WBC 16.6*  --  12.2* 10.2 9.8  HGB 13.2 11.3* 10.7* 10.9* 9.7*  HCT 39.4 35.0* 32.8* 33.5* 30.4*  MCV 90.4  --  90.6 91.5 93.3  PLT 266  --  209 227 361    Basic Metabolic Panel: Recent Labs  Lab 08/02/17 1741 08/05/17 0409  NA 134* 141  K 3.4* 3.7  CL 103 110  CO2 25 25  GLUCOSE 136* 108*  BUN 26* 7  CREATININE 0.83 0.80  CALCIUM 8.8* 8.4*    GFR: Estimated Creatinine Clearance: 139.1 mL/min (by C-G formula based on SCr of 0.8 mg/dL).  Liver Function Tests: Recent Labs  Lab 08/02/17 1741  AST 22  ALT 20  ALKPHOS 69  BILITOT 0.6  PROT 6.6  ALBUMIN 3.3*    Recent Labs  Lab 08/02/17 1741  LIPASE 27    Recent Results (from the past 240 hour(s))  MRSA PCR Screening     Status: None   Collection Time: 08/03/17  5:48 AM  Result Value Ref Range Status   MRSA by PCR NEGATIVE NEGATIVE Final    Comment:        The  GeneXpert MRSA Assay (FDA approved for NASAL specimens only), is one component of a comprehensive MRSA colonization surveillance program. It is not intended to diagnose MRSA infection nor to guide or monitor treatment for MRSA infections. Performed at [facility], [place] [address] [place], [place] [address]       Radiology Studies: No results found.   Medications:  Scheduled: . cloZAPine  400 mg Oral QPM  . pantoprazole  40 mg Oral BID  . polyethylene glycol  17 g Oral Daily  . sucralfate  1 g Oral TID WC & HS   Continuous: . sodium chloride 50 mL/hr at 08/05/17 2633   HLK:[id number] **OR** acetaminophen, ondansetron **OR** ondansetron (ZOFRAN) IV  Assessment/Plan:  Active Problems:   Hematemesis   Hypokalemia   Leucocytosis    GI bleed,  thought to be upper EGD showed severe ulcerative esophagitis.  Visible vessel with clot but no active bleeding was noted.  Patient continued on PPI and sucralfate. Follow-up with GI in 6 weeks and repeat EGD in 2 months.  Hold aspirin for now which he takes for preventive purposes only.  Drop in hemoglobin noted this morning.  He did have stools yesterday which are predominantly brown colored mixed with a little black.  This could be old blood coming out. Discussed with gastroenterology who recommends monitoring for additional day.  We will repeat hemoglobin later today.  Acute blood loss anemia Drop in hemoglobin is noted this morning.  See discussion above.  We will repeat hemoglobin later today.  Sinus tachycardia Seems to be asymptomatic.  Stable.  History of mental retardation Stable and at baseline.  Leukocytosis Possibly reactive due to acute illness.  Afebrile.  Subsequently normal.   DVT Prophylaxis: SCDs    Code Status: Full code Family Communication: Discussed with the patient's parents Disposition Plan: Management as outlined above.  Repeat hemoglobin later today and tomorrow.    LOS: 3 days   [facility] Pager [phone] 08/05/2017, 9:24 AM  If 7PM-7AM, please contact night-coverage at [email or web address], password [facility]

## 2017-08-05 NOTE — Progress Notes (Signed)
[facility] PROGRESS NOTE Subjective Patient dropped his hemoglobin just a bit but is not having any signs of active bleeding.  Repeat hemoglobin is been ordered.  Objective: Vital signs in last 24 hours: Temp:  [98.5 F (36.9 C)-98.8 F (37.1 C)] 98.5 F (36.9 C) (04/05 0635) Pulse Rate:  [101-113] 101 (04/05 0635) Resp:  [16-18] 18 (04/05 0635) BP: (104-134)/(59-80) 104/59 (04/05 0635) SpO2:  [98 %-99 %] 99 % (04/05 0635) Last BM Date: 08/04/17  Intake/Output from previous day: 04/04 0701 - 04/05 0700 In: 1919.6 [I.V.:1919.6] Out: -  Intake/Output this shift: No intake/output data recorded.  PE: General--sitting up in bed swallowing full liquids given to him by his mother. -  Lab Results: Recent Labs    08/02/17 1741 08/02/17 2232 08/03/17 0650 08/04/17 1038 08/05/17 0409 08/05/17 1135  WBC 16.6*  --  12.2* 10.2 9.8 10.7*  HGB 13.2 11.3* 10.7* 10.9* 9.7* 10.5*  HCT 39.4 35.0* 32.8* 33.5* 30.4* 32.4*  PLT 266  --  209 227 243 250   BMET Recent Labs    08/02/17 1741 08/05/17 0409  NA 134* 141  K 3.4* 3.7  CL 103 110  CO2 25 25  CREATININE 0.83 0.80   LFT Recent Labs    08/02/17 1741  PROT 6.6  AST 22  ALT 20  ALKPHOS 69  BILITOT 0.6   PT/INR No results for input(s): LABPROT, INR in the last 72 hours. PANCREAS Recent Labs    08/02/17 1741  LIPASE 27         Studies/Results: No results found.  Medications: I have reviewed the patient's current medications.  Assessment:   1.  Upper GI bleed due to severe esophagitis with visible vessel and esophageal ulcer.   Plan: Have discussed again with mother and the head of the group home that he will need to be on a very soft diet for the next week at least.  Should be discharged on twice daily PPI and Carafate suspension for 1 week.  I think he could stop at the end of 1 week of the Carafate suspension but I would continue the PPI twice daily until he sees me in the office in  approximately 1 month.  He will need a repeat EGD in a couple months to document healing of the severe ulcerations of his esophagus.   Nancy Fetter 08/05/2017, 12:49 PM  This note was created using voice recognition software. Minor errors may Have occurred unintentionally.  Pager: [phone] If no answer or after hours call [phone]

## 2017-08-06 LAB — CBC WITH DIFFERENTIAL/PLATELET
BASOS PCT: 1 %
Basophils Absolute: 0.1 10*3/uL (ref 0.0–0.1)
Eosinophils Absolute: 0.3 10*3/uL (ref 0.0–0.7)
Eosinophils Relative: 3 %
HCT: 32.3 % — ABNORMAL LOW (ref 39.0–52.0)
HEMOGLOBIN: 10.3 g/dL — AB (ref 13.0–17.0)
Lymphocytes Relative: 38 %
Lymphs Abs: 3.7 10*3/uL (ref 0.7–4.0)
MCH: 29.9 pg (ref 26.0–34.0)
MCHC: 31.9 g/dL (ref 30.0–36.0)
MCV: 93.9 fL (ref 78.0–100.0)
Monocytes Absolute: 0.8 10*3/uL (ref 0.1–1.0)
Monocytes Relative: 8 %
NEUTROS PCT: 50 %
Neutro Abs: 5 10*3/uL (ref 1.7–7.7)
Platelets: 255 10*3/uL (ref 150–400)
RBC: 3.44 MIL/uL — AB (ref 4.22–5.81)
RDW: 15.5 % (ref 11.5–15.5)
WBC: 9.8 10*3/uL (ref 4.0–10.5)

## 2017-08-06 MED ORDER — SUCRALFATE 1 GM/10ML PO SUSP: 1.0000 g | Freq: Three times a day (TID) | ORAL | 0 refills | Status: DC

## 2017-08-06 MED ORDER — POLYETHYLENE GLYCOL 3350 17 G PO PACK: 17.0000 g | PACK | Freq: Every day | ORAL | 0 refills | Status: DC | PRN

## 2017-08-06 MED ORDER — ONDANSETRON HCL 4 MG PO TABS: 4.0000 mg | ORAL_TABLET | Freq: Four times a day (QID) | ORAL | 0 refills | Status: DC | PRN

## 2017-08-06 MED ORDER — PANTOPRAZOLE SODIUM 40 MG PO TBEC: 40.0000 mg | DELAYED_RELEASE_TABLET | Freq: Two times a day (BID) | ORAL | 1 refills | Status: DC

## 2017-08-06 NOTE — Discharge Instructions (Signed)
Esophagitis Esophagitis is inflammation of the esophagus. The esophagus is the tube that carries food and liquids from your mouth to your stomach. Esophagitis can cause soreness or pain in the esophagus. This condition can make it difficult and painful to swallow. What are the causes? Most causes of esophagitis are not serious. Common causes of this condition include:  Gastroesophageal reflux disease (GERD). This is when stomach contents move back up into the esophagus (reflux).  Repeated vomiting.  An allergic-type reaction, especially caused by food allergies (eosinophilic esophagitis).  Injury to the esophagus by swallowing large pills with or without water, or swallowing certain types of medicines.  Swallowing (ingesting) harmful chemicals, such as household cleaning products.  Heavy alcohol use.  An infection of the esophagus.This most often occurs in people who have a weakened immune system.  Radiation or chemotherapy treatment for cancer.  Certain diseases such as sarcoidosis, Crohn disease, and scleroderma.  What are the signs or symptoms? Symptoms of this condition include:  Difficult or painful swallowing.  Pain with swallowing acidic liquids, such as citrus juices.  Pain with burping.  Chest pain.  Difficulty breathing.  Nausea.  Vomiting.  Pain in the abdomen.  Weight loss.  Ulcers in the mouth.  Patches of white material in the mouth (candidiasis).  Fever.  Coughing up blood or vomiting blood.  Stool that is black, tarry, or bright red.  How is this diagnosed? Your health care provider will take a medical history and perform a physical exam. You may also have other tests, including:  An endoscopy to examine your stomach and esophagus with a small camera.  A test that measures the acidity level in your esophagus.  A test that measures how much pressure is on your esophagus.  A barium swallow or modified barium swallow to show the shape,  size, and functioning of your esophagus.  Allergy tests.  How is this treated? Treatment for this condition depends on the cause of your esophagitis. In some cases, steroids or other medicines may be given to help relieve your symptoms or to treat the underlying cause of your condition. You may have to make some lifestyle changes, such as:  Avoiding alcohol.  Quitting smoking.  Changing your diet.  Exercising.  Changing your sleep habits and your sleep environment.  Follow these instructions at home: Take these actions to decrease your discomfort and to help avoid complications. Diet  Follow a diet as recommended by your health care provider. This may involve avoiding foods and drinks such as: ? Coffee and tea (with or without caffeine). ? Drinks that contain alcohol. ? Energy drinks and sports drinks. ? Carbonated drinks or sodas. ? Chocolate and cocoa. ? Peppermint and mint flavorings. ? Garlic and onions. ? Horseradish. ? Spicy and acidic foods, including peppers, chili powder, curry powder, vinegar, hot sauces, and barbecue sauce. ? Citrus fruit juices and citrus fruits, such as oranges, lemons, and limes. ? Tomato-based foods, such as red sauce, chili, salsa, and pizza with red sauce. ? Fried and fatty foods, such as donuts, french fries, potato chips, and high-fat dressings. ? High-fat meats, such as hot dogs and fatty cuts of red and white meats, such as rib eye steak, sausage, ham, and bacon. ? High-fat dairy items, such as whole milk, butter, and cream cheese.  Eat small, frequent meals instead of large meals.  Avoid drinking large amounts of liquid with your meals.  Avoid eating meals during the 2-3 hours before bedtime.  Avoid lying down right   after you eat.  Do not exercise right after you eat.  Avoid foods and drinks that seem to make your symptoms worse. General instructions  Pay attention to any changes in your symptoms.  Take over-the-counter and  prescription medicines only as told by your health care provider. Do not take aspirin, ibuprofen, or other NSAIDs unless your health care provider told you to do so.  If you have trouble taking pills, use a pill splitter to decrease the size of the pill. This will decrease the chance of the pill getting stuck or injuring your esophagus on the way down. Also, drink water after you take a pill.  Do not use any tobacco products, including cigarettes, chewing tobacco, and e-cigarettes. If you need help quitting, ask your health care provider.  Wear loose-fitting clothing. Do not wear anything tight around your waist that causes pressure on your abdomen.  Raise (elevate) the head of your bed about 6 inches (15 cm).  Try to reduce your stress, such as with yoga or meditation. If you need help reducing stress, ask your health care provider.  If you are overweight, reduce your weight to an amount that is healthy for you. Ask your health care provider for guidance about a safe weight loss goal.  Keep all follow-up visits as told by your health care provider. This is important. Contact a health care provider if:  You have new symptoms.  You have unexplained weight loss.  You have difficulty swallowing, or it hurts to swallow.  You have wheezing or a persistent cough.  Your symptoms do not improve with treatment.  You have frequent heartburn for more than two weeks. Get help right away if:  You have severe pain in your arms, neck, jaw, teeth, or back.  You feel sweaty, dizzy, or light-headed.  You have chest pain or shortness of breath.  You vomit and your vomit looks like blood or coffee grounds.  Your stool is bloody or black.  You have a fever.  You cannot swallow, drink, or eat. This information is not intended to replace advice given to you by your health care provider. Make sure you discuss any questions you have with your health care provider. Document Released: 05/27/2004  Document Revised: 09/25/2015 Document Reviewed: 08/14/2014 Elsevier Interactive Patient Education  2018 [employer]     

## 2017-08-06 NOTE — Progress Notes (Signed)
Went over all discharge papers with patient and family.  All questions answered.  AVS given and made aware of prescriptions called into pharmacy.  VSS.  Pt wheeled out via NT.

## 2017-08-06 NOTE — Discharge Summary (Addendum)
[facility]  Physician Discharge Summary   Patient ID: Spencer Cummings MRN: [id number] DOB/AGE: Dec 03, 1975 42 y.o.  Admit date: 08/02/2017 Discharge date: 08/06/2017  PCP: Lawerance Cruel, MD  DISCHARGE DIAGNOSES:  Active Problems:   Hematemesis   Hypokalemia   Leucocytosis   RECOMMENDATIONS FOR OUTPATIENT FOLLOW UP: 1. Outpatient follow-up with gastroenterology 2. Aspirin to be discontinued for now.   DISCHARGE CONDITION: fair  Diet recommendation: Soft diet  Filed Weights   08/02/17 2150  Weight: 97.6 kg (215 lb 2.7 oz)    INITIAL HISTORY: 42 year old Caucasian male with a history of mental retardation who lives in a group home brought in with episodes of hematemesis and also mention of bright red blood per rectum.  Patient was hospitalized for further management.  Patient underwent EGD which showed severe reflux esophagitis.  Consultants: [facility]  Procedures:  EGD Impression:  - Severe reflux esophagitis. Visible vessel with clot present but no active bleeding. - Medium-sized hiatal hernia. - Normal examined duodenum.  Normal stomach. - No specimens collected.   HOSPITAL COURSE:    GI bleed secondary to ulcerative esophagitis EGD showed severe ulcerative esophagitis.  Visible vessel with clot but no active bleeding was noted.  Patient continued on PPI and sucralfate. Follow-up with GI in 6 weeks and repeat EGD in 2 months.  Hold aspirin for now which he takes for preventive purposes only.   Acute blood loss anemia Patient did have some drop in hemoglobin with some fluctuation.  Did not require blood transfusion.  Hemoglobin stable over the last 24 hours.    Sinus tachycardia Mainly with exertion.  Seems to be asymptomatic.   History of mental retardation Stable and at baseline.  Not very communicative at baseline.  Leukocytosis Possibly reactive due to acute illness.  Afebrile.  Subsequently  normal.  Overall stable.  Discussed in detail with patient's parents.  He did have an episode of vomiting after breakfast this morning.  Apparently he has these episodes when he eats too quickly.  His parents feel that was the case this morning.  His abdomen is benign.  He was observed till lunchtime.  He tolerated lunch without any difficulties.  Okay for discharge to group home.   PERTINENT LABS:  The results of significant diagnostics from this hospitalization (including imaging, microbiology, ancillary and laboratory) are listed below for reference.    Microbiology: Recent Results (from the past 240 hour(s))  MRSA PCR Screening     Status: None   Collection Time: 08/03/17  5:48 AM  Result Value Ref Range Status   MRSA by PCR NEGATIVE NEGATIVE Final    Comment:        The GeneXpert MRSA Assay (FDA approved for NASAL specimens only), is one component of a comprehensive MRSA colonization surveillance program. It is not intended to diagnose MRSA infection nor to guide or monitor treatment for MRSA infections. Performed at [facility], [place] [address], [place], [place] [address]      Labs: Basic Metabolic Panel: Recent Labs  Lab 08/02/17 1741 08/05/17 0409  NA 134* 141  K 3.4* 3.7  CL 103 110  CO2 25 25  GLUCOSE 136* 108*  BUN 26* 7  CREATININE 0.83 0.80  CALCIUM 8.8* 8.4*   Liver Function Tests: Recent Labs  Lab 08/02/17 1741  AST 22  ALT 20  ALKPHOS 69  BILITOT 0.6  PROT 6.6  ALBUMIN 3.3*   Recent Labs  Lab 08/02/17 1741  LIPASE 27   CBC:  Recent Labs  Lab 08/03/17 0650 08/04/17 1038 08/05/17 0409 08/05/17 1135 08/06/17 0410  WBC 12.2* 10.2 9.8 10.7* 9.8  NEUTROABS  --   --   --   --  5.0  HGB 10.7* 10.9* 9.7* 10.5* 10.3*  HCT 32.8* 33.5* 30.4* 32.4* 32.3*  MCV 90.6 91.5 93.3 91.8 93.9  PLT 209 227 243 250 255     IMAGING STUDIES No results found.  DISCHARGE EXAMINATION: Vitals:   08/05/17 0635 08/05/17 1423  08/05/17 2041 08/06/17 0434  BP: (!) 104/59 125/81 105/61 101/63  Pulse: (!) 101 (!) 120 (!) 107 (!) 102  Resp: 18  16 18   Temp: 98.5 F (36.9 C) 97.7 F (36.5 C) 98.9 F (37.2 C) 98.4 F (36.9 C)  TempSrc: Oral Oral Oral Axillary  SpO2: 99% 98% 98% 96%  Weight:      Height:       General appearance: alert, cooperative, appears stated age and no distress Resp: clear to auscultation bilaterally Cardio: regular rate and rhythm, S1, S2 normal, no murmur, click, rub or gallop GI: soft, non-tender; bowel sounds normal; no masses,  no organomegaly  DISPOSITION: Group home with parents  Discharge Instructions    Call MD for:  difficulty breathing, headache or visual disturbances   Complete by:  As directed    Call MD for:  extreme fatigue   Complete by:  As directed    Call MD for:  persistant dizziness or light-headedness   Complete by:  As directed    Call MD for:  persistant nausea and vomiting   Complete by:  As directed    Call MD for:  severe uncontrolled pain   Complete by:  As directed    Call MD for:  temperature >100.4   Complete by:  As directed    Discharge instructions   Complete by:  As directed    Take your medications as prescribed.  Seek attention if you notice black stools or blood in the stools.  Stop taking aspirin for now.  Follow-up with your primary care provider to discuss this further.  Avoid any other kind of NSAIDs as well.  Eat a soft diet for the next 7 days and then you may resume your usual diet.  You were cared for by a hospitalist during your hospital stay. If you have any questions about your discharge medications or the care you received while you were in the hospital after you are discharged, you can call the unit and asked to speak with the hospitalist on call if the hospitalist that took care of you is not available. Once you are discharged, your primary care physician will handle any further medical issues. Please note that NO REFILLS for any  discharge medications will be authorized once you are discharged, as it is imperative that you return to your primary care physician (or establish a relationship with a primary care physician if you do not have one) for your aftercare needs so that they can reassess your need for medications and monitor your lab values. If you do not have a primary care physician, you can call [phone] for a physician referral.   Increase activity slowly   Complete by:  As directed         Allergies as of 08/06/2017      Reactions   Lamictal [lamotrigine] Rash   Tongue thrush, burning, swelling, elbow eczema, sores on knee, finger, wrist, shinn and foot in 2004   Mellaril [thioridazine] Other (See Comments)  Neuroleptic malignant syndrome   Zoloft [sertraline Hcl] Other (See Comments)   Uncontrolled salvia, weak, limber - occurred very quickly after getting this medication      Medication List    STOP taking these medications   aspirin EC 81 MG tablet     TAKE these medications   cloZAPine 100 MG tablet Commonly known as:  CLOZARIL Take 400 mg by mouth every evening.   ondansetron 4 MG tablet Commonly known as:  ZOFRAN Take 1 tablet (4 mg total) by mouth every 6 (six) hours as needed for nausea.   pantoprazole 40 MG tablet Commonly known as:  PROTONIX Take 1 tablet (40 mg total) by mouth 2 (two) times daily.   polyethylene glycol packet Commonly known as:  MIRALAX / GLYCOLAX Take 17 g by mouth daily as needed.   sucralfate 1 GM/10ML suspension Commonly known as:  CARAFATE Take 10 mLs (1 g total) by mouth 4 (four) times daily -  with meals and at bedtime for 7 days.        Follow-up Information    Laurence Spates, MD. Schedule an appointment as soon as possible for a visit in 1 month(s).   Specialty:  Gastroenterology Contact information: [address] [place] [address] [phone]        Lawerance Cruel, MD. Schedule an appointment as soon as possible  for a visit in 1 week(s).   Specialty:  Family Medicine Contact information: [place] [address] [phone]           TOTAL DISCHARGE TIME: 35 minutes  [facility] Pager [phone]  08/06/2017, 3:02 PM

## 2017-08-15 DIAGNOSIS — Z79899 Other long term (current) drug therapy: Secondary | ICD-10-CM | POA: Diagnosis not present

## 2017-08-17 DIAGNOSIS — K922 Gastrointestinal hemorrhage, unspecified: Secondary | ICD-10-CM | POA: Diagnosis not present

## 2017-08-17 DIAGNOSIS — K219 Gastro-esophageal reflux disease without esophagitis: Secondary | ICD-10-CM | POA: Diagnosis not present

## 2017-08-31 DIAGNOSIS — K2211 Ulcer of esophagus with bleeding: Secondary | ICD-10-CM | POA: Diagnosis not present

## 2017-09-07 DIAGNOSIS — F28 Other psychotic disorder not due to a substance or known physiological condition: Secondary | ICD-10-CM | POA: Diagnosis not present

## 2017-09-07 DIAGNOSIS — F70 Mild intellectual disabilities: Secondary | ICD-10-CM | POA: Diagnosis not present

## 2017-09-14 DIAGNOSIS — Z79899 Other long term (current) drug therapy: Secondary | ICD-10-CM | POA: Diagnosis not present

## 2017-09-14 DIAGNOSIS — F70 Mild intellectual disabilities: Secondary | ICD-10-CM | POA: Diagnosis not present

## 2017-09-14 DIAGNOSIS — F209 Schizophrenia, unspecified: Secondary | ICD-10-CM | POA: Diagnosis not present

## 2017-11-10 DIAGNOSIS — K2210 Ulcer of esophagus without bleeding: Secondary | ICD-10-CM | POA: Diagnosis not present

## 2017-11-10 DIAGNOSIS — K219 Gastro-esophageal reflux disease without esophagitis: Secondary | ICD-10-CM | POA: Diagnosis not present

## 2017-11-10 DIAGNOSIS — K449 Diaphragmatic hernia without obstruction or gangrene: Secondary | ICD-10-CM | POA: Diagnosis not present

## 2017-11-22 DIAGNOSIS — F71 Moderate intellectual disabilities: Secondary | ICD-10-CM | POA: Diagnosis not present

## 2017-11-22 DIAGNOSIS — F0632 Mood disorder due to known physiological condition with major depressive-like episode: Secondary | ICD-10-CM | POA: Diagnosis not present

## 2017-12-15 DIAGNOSIS — Z131 Encounter for screening for diabetes mellitus: Secondary | ICD-10-CM | POA: Diagnosis not present

## 2017-12-15 DIAGNOSIS — K2211 Ulcer of esophagus with bleeding: Secondary | ICD-10-CM | POA: Diagnosis not present

## 2017-12-15 DIAGNOSIS — E782 Mixed hyperlipidemia: Secondary | ICD-10-CM | POA: Diagnosis not present

## 2017-12-16 DIAGNOSIS — F3181 Bipolar II disorder: Secondary | ICD-10-CM | POA: Diagnosis not present

## 2017-12-16 DIAGNOSIS — K2211 Ulcer of esophagus with bleeding: Secondary | ICD-10-CM | POA: Diagnosis not present

## 2017-12-16 DIAGNOSIS — Z131 Encounter for screening for diabetes mellitus: Secondary | ICD-10-CM | POA: Diagnosis not present

## 2017-12-16 DIAGNOSIS — F79 Unspecified intellectual disabilities: Secondary | ICD-10-CM | POA: Diagnosis not present

## 2017-12-16 DIAGNOSIS — Z0000 Encounter for general adult medical examination without abnormal findings: Secondary | ICD-10-CM | POA: Diagnosis not present

## 2017-12-16 DIAGNOSIS — E782 Mixed hyperlipidemia: Secondary | ICD-10-CM | POA: Diagnosis not present

## 2017-12-26 DIAGNOSIS — F0632 Mood disorder due to known physiological condition with major depressive-like episode: Secondary | ICD-10-CM | POA: Diagnosis not present

## 2018-01-03 DIAGNOSIS — F0632 Mood disorder due to known physiological condition with major depressive-like episode: Secondary | ICD-10-CM | POA: Diagnosis not present

## 2018-01-03 DIAGNOSIS — F71 Moderate intellectual disabilities: Secondary | ICD-10-CM | POA: Diagnosis not present

## 2018-01-25 DIAGNOSIS — F0632 Mood disorder due to known physiological condition with major depressive-like episode: Secondary | ICD-10-CM | POA: Diagnosis not present

## 2018-02-24 DIAGNOSIS — F0632 Mood disorder due to known physiological condition with major depressive-like episode: Secondary | ICD-10-CM | POA: Diagnosis not present

## 2018-04-03 DIAGNOSIS — F0632 Mood disorder due to known physiological condition with major depressive-like episode: Secondary | ICD-10-CM | POA: Diagnosis not present

## 2018-04-06 DIAGNOSIS — F71 Moderate intellectual disabilities: Secondary | ICD-10-CM | POA: Diagnosis not present

## 2018-04-06 DIAGNOSIS — F0632 Mood disorder due to known physiological condition with major depressive-like episode: Secondary | ICD-10-CM | POA: Diagnosis not present

## 2018-05-01 DIAGNOSIS — F0632 Mood disorder due to known physiological condition with major depressive-like episode: Secondary | ICD-10-CM | POA: Diagnosis not present

## 2018-05-10 DIAGNOSIS — L609 Nail disorder, unspecified: Secondary | ICD-10-CM | POA: Diagnosis not present

## 2018-05-10 DIAGNOSIS — R3989 Other symptoms and signs involving the genitourinary system: Secondary | ICD-10-CM | POA: Diagnosis not present

## 2018-05-10 DIAGNOSIS — R21 Rash and other nonspecific skin eruption: Secondary | ICD-10-CM | POA: Diagnosis not present

## 2018-05-10 DIAGNOSIS — R8290 Unspecified abnormal findings in urine: Secondary | ICD-10-CM | POA: Diagnosis not present

## 2018-06-01 DIAGNOSIS — F0632 Mood disorder due to known physiological condition with major depressive-like episode: Secondary | ICD-10-CM | POA: Diagnosis not present

## 2018-06-29 DIAGNOSIS — F0632 Mood disorder due to known physiological condition with major depressive-like episode: Secondary | ICD-10-CM | POA: Diagnosis not present

## 2018-07-06 DIAGNOSIS — L600 Ingrowing nail: Secondary | ICD-10-CM | POA: Diagnosis not present

## 2018-07-27 DIAGNOSIS — F0632 Mood disorder due to known physiological condition with major depressive-like episode: Secondary | ICD-10-CM | POA: Diagnosis not present

## 2018-08-18 ENCOUNTER — Encounter (HOSPITAL_COMMUNITY): Payer: Self-pay | Admitting: Emergency Medicine

## 2018-08-18 ENCOUNTER — Other Ambulatory Visit: Payer: Self-pay

## 2018-08-18 ENCOUNTER — Emergency Department (HOSPITAL_COMMUNITY): Payer: Medicare Other

## 2018-08-18 ENCOUNTER — Emergency Department (HOSPITAL_COMMUNITY)
Admission: EM | Admit: 2018-08-18 | Discharge: 2018-08-18 | Disposition: A | Discharge status: Home / Self Care | Payer: Medicare Other | Attending: Emergency Medicine | Admitting: Emergency Medicine

## 2018-08-18 DIAGNOSIS — Z9889 Other specified postprocedural states: Secondary | ICD-10-CM | POA: Insufficient documentation

## 2018-08-18 DIAGNOSIS — L03031 Cellulitis of right toe: Secondary | ICD-10-CM

## 2018-08-18 DIAGNOSIS — Q843 Anonychia: Secondary | ICD-10-CM

## 2018-08-18 DIAGNOSIS — F79 Unspecified intellectual disabilities: Secondary | ICD-10-CM | POA: Insufficient documentation

## 2018-08-18 DIAGNOSIS — Z79899 Other long term (current) drug therapy: Secondary | ICD-10-CM | POA: Diagnosis not present

## 2018-08-18 DIAGNOSIS — F319 Bipolar disorder, unspecified: Secondary | ICD-10-CM | POA: Diagnosis not present

## 2018-08-18 DIAGNOSIS — L089 Local infection of the skin and subcutaneous tissue, unspecified: Secondary | ICD-10-CM | POA: Diagnosis not present

## 2018-08-18 DIAGNOSIS — L608 Other nail disorders: Secondary | ICD-10-CM | POA: Diagnosis not present

## 2018-08-18 LAB — CBC WITH DIFFERENTIAL/PLATELET
Abs Immature Granulocytes: 0.03 10*3/uL (ref 0.00–0.07)
Basophils Absolute: 0.1 10*3/uL (ref 0.0–0.1)
Basophils Relative: 1 %
Eosinophils Absolute: 0.3 10*3/uL (ref 0.0–0.5)
Eosinophils Relative: 3 %
HCT: 49.1 % (ref 39.0–52.0)
Hemoglobin: 15 g/dL (ref 13.0–17.0)
Immature Granulocytes: 0 %
Lymphocytes Relative: 32 %
Lymphs Abs: 2.9 10*3/uL (ref 0.7–4.0)
MCH: 27.6 pg (ref 26.0–34.0)
MCHC: 30.5 g/dL (ref 30.0–36.0)
MCV: 90.4 fL (ref 80.0–100.0)
Monocytes Absolute: 0.7 10*3/uL (ref 0.1–1.0)
Monocytes Relative: 7 %
Neutro Abs: 5.1 10*3/uL (ref 1.7–7.7)
Neutrophils Relative %: 57 %
Platelets: 221 10*3/uL (ref 150–400)
RBC: 5.43 MIL/uL (ref 4.22–5.81)
RDW: 15.6 % — ABNORMAL HIGH (ref 11.5–15.5)
WBC: 9.1 10*3/uL (ref 4.0–10.5)
nRBC: 0 % (ref 0.0–0.2)

## 2018-08-18 LAB — BASIC METABOLIC PANEL
Anion gap: 6 (ref 5–15)
BUN: 14 mg/dL (ref 6–20)
CO2: 29 mmol/L (ref 22–32)
Calcium: 9.7 mg/dL (ref 8.9–10.3)
Chloride: 106 mmol/L (ref 98–111)
Creatinine, Ser: 1.01 mg/dL (ref 0.61–1.24)
GFR calc Af Amer: >60 mL/min (ref >60)
GFR calc non Af Amer: >60 mL/min (ref >60)
Glucose, Bld: 101 mg/dL — ABNORMAL HIGH (ref 70–99)
Potassium: 4.1 mmol/L (ref 3.5–5.1)
Sodium: 141 mmol/L (ref 135–145)

## 2018-08-18 LAB — LACTIC ACID, PLASMA: Lactic Acid, Venous: 1.4 mmol/L (ref 0.5–1.9)

## 2018-08-18 MED ORDER — CEPHALEXIN 500 MG PO CAPS: 500.0000 mg | ORAL_CAPSULE | Freq: Four times a day (QID) | ORAL | 0 refills | Status: AC

## 2018-08-18 NOTE — Discharge Instructions (Addendum)
Your laboratory results were within normal limits today.  Your x-ray of the right foot was negative.  I have prescribed antibiotics, please 1 tablet 4 times a day for the next 7 days.  Please use this prescription if condition worsens.  You may follow-up with your primary care physician as needed.  If you develop any fever, worsening pain, you may return to the emergency department.

## 2018-08-18 NOTE — ED Notes (Signed)
Per PA Soto don't need another lactic acid

## 2018-08-18 NOTE — ED Notes (Signed)
Blood work drawn in triage, left in triage

## 2018-08-18 NOTE — ED Triage Notes (Addendum)
Pt here with mother for eval of right great toe infection. Had the nail removed and redness and drainage is spreading upwards. Mother is legal guardian.

## 2018-08-18 NOTE — ED Provider Notes (Addendum)
[facility] EMERGENCY DEPARTMENT Provider Note   CSN: [id number] Arrival date & time: 08/18/18  1541    History   Chief Complaint Chief Complaint  Patient presents with  . Nail Problem    HPI Spencer Cummings is a 43 y.o. male.     43 y.o male with a PMH of cognitive developmental delay, bipolar disorder presents to the ED brought in by mother for right toe examination.  Mother reports patient had his right great toenail pulled at the beginning of March by his PCP.  Mother reports patient has had no soaks daily by the group home, she reports that they had been doing soaks twice a day.  She noted some redness to the area, was concerned that there might be any signs of infection.  Attempted to call Dr. Harrington Challenger patient's PCP but was unable to get an appointment over the weekend.  She denies any fever, pain to the site although patient is unable to tell us if pain is present, or drainage from the wound.     Past Medical History:  Diagnosis Date  . Bipolar disorder ([place])   . Cognitive developmental delay     Patient Active Problem List   Diagnosis Date Noted  . Hypokalemia 08/03/2017  . Leucocytosis 08/03/2017  . Hematemesis 08/02/2017    Past Surgical History:  Procedure Laterality Date  . ESOPHAGOGASTRODUODENOSCOPY N/A 08/03/2017   Procedure: ESOPHAGOGASTRODUODENOSCOPY (EGD);  Surgeon: Laurence Spates, MD;  Location: [facility];  Service: Endoscopy;  Laterality: N/A;  . LAPAROSCOPIC CHOLECYSTECTOMY  2011        Home Medications    Prior to Admission medications   Medication Sig Start Date End Date Taking? Authorizing Provider  cephALEXin (KEFLEX) 500 MG capsule Take 1 capsule (500 mg total) by mouth 4 (four) times daily for 7 days. 08/18/18 08/25/18  Janeece Fitting, PA-C  cloZAPine (CLOZARIL) 100 MG tablet Take 400 mg by mouth every evening. 07/12/17   [provider]  ondansetron (ZOFRAN) 4 MG tablet Take 1 tablet (4 mg total) by mouth every 6 (six)  hours as needed for nausea. 08/06/17   Bonnielee Haff, MD  pantoprazole (PROTONIX) 40 MG tablet Take 1 tablet (40 mg total) by mouth 2 (two) times daily. 08/06/17   Bonnielee Haff, MD  polyethylene glycol [facility] / Floria Raveling) packet Take 17 g by mouth daily as needed. 08/06/17   Bonnielee Haff, MD  sucralfate (CARAFATE) 1 GM/10ML suspension Take 10 mLs (1 g total) by mouth 4 (four) times daily -  with meals and at bedtime for 7 days. 08/06/17 08/13/17  Bonnielee Haff, MD    Family History Family History  Problem Relation Age of Onset  . GER disease Mother   . GER disease Father   . GI Bleed Neg Hx        Some question if patients great grandfather may have had GI Bleed, but not sure.    Social History Social History   Tobacco Use  . Smoking status: Never Smoker  . Smokeless tobacco: Never Used  Substance Use Topics  . Alcohol use: Never    Frequency: Never  . Drug use: Never     Allergies   Lamictal [lamotrigine]; Mellaril [thioridazine]; and Zoloft [sertraline hcl]   Review of Systems Review of Systems  Constitutional: Negative for chills and fever.  HENT: Negative for ear pain and sore throat.   Eyes: Negative for pain and visual disturbance.  Respiratory: Negative for cough and shortness of breath.  Cardiovascular: Negative for chest pain and palpitations.  Gastrointestinal: Negative for abdominal pain and vomiting.  Genitourinary: Negative for dysuria and hematuria.  Musculoskeletal: Negative for arthralgias and back pain.  Skin: Positive for wound. Negative for color change and rash.  Neurological: Negative for seizures and syncope.  All other systems reviewed and are negative.    Physical Exam Updated Vital Signs BP (!) 126/95   Pulse 100   Temp 98.5 F (36.9 C) (Oral)   Resp 18   Ht 5\' 9"  (1.753 m)   Wt 90.7 kg   SpO2 96%   BMI 29.53 kg/m   Physical Exam Vitals signs and nursing note reviewed.  Constitutional:      Appearance: He is well-developed.   HENT:     Head: Normocephalic and atraumatic.  Eyes:     General: No scleral icterus.    Pupils: Pupils are equal, round, and reactive to light.  Neck:     Musculoskeletal: Normal range of motion.  Cardiovascular:     Pulses:          Dorsalis pedis pulses are 2+ on the left side.       Posterior tibial pulses are 2+ on the left side.     Heart sounds: Normal heart sounds.  Pulmonary:     Effort: Pulmonary effort is normal.     Breath sounds: Normal breath sounds. No wheezing.  Chest:     Chest wall: No tenderness.  Abdominal:     General: Bowel sounds are normal. There is no distension.     Palpations: Abdomen is soft.     Tenderness: There is no abdominal tenderness.  Musculoskeletal:        General: No tenderness or deformity.  Feet:     Right foot:     Skin integrity: Skin integrity normal.     Toenail Condition: Right toenails are abnormally thick.     Left foot:     Skin integrity: Erythema present. No warmth or callus.     Comments: Pulses present, capillary refill is intact.  Please see photos for toe injury. Skin:    General: Skin is warm and dry.  Neurological:     Mental Status: He is alert and oriented to person, place, and time.          ED Treatments / Results  Labs (all labs ordered are listed, but only abnormal results are displayed) Labs Reviewed  BASIC METABOLIC PANEL - Abnormal; Notable for the following components:      Result Value   Glucose, Bld 101 (*)    All other components within normal limits  CBC WITH DIFFERENTIAL/PLATELET - Abnormal; Notable for the following components:   RDW 15.6 (*)    All other components within normal limits  LACTIC ACID, PLASMA    EKG None  Radiology Dg Foot Complete Right  Result Date: 08/18/2018 CLINICAL DATA:  Pt had toenail removed March 5th and it has become red moving from the toenail bed towards the pip EXAM: RIGHT FOOT COMPLETE - 3+ VIEW COMPARISON:  None. FINDINGS: There is no evidence of  fracture or dislocation. There is no evidence of arthropathy or other focal bone abnormality. Soft tissues are unremarkable. IMPRESSION: Negative. Electronically Signed   By: Kathreen Devoid   On: 08/18/2018 19:26    Procedures Procedures (including critical care time)  Medications Ordered in ED Medications - No data to display   Initial Impression / Assessment and Plan / ED Course  I have  reviewed the triage vital signs and the nursing notes.  Pertinent labs & imaging results that were available during my care of the patient were reviewed by me and considered in my medical decision making (see chart for details).    Patient presents brought in by mother for right toe nail removal x 6 weeks by PCP. Mother reports she has been following up closely with PCP sending photos via text along with doing warm soaks daily.  During evaluation nail is absent, toe appears slightly erythematous.  Otherwise reassuring.  CBC showed no leukocytosis, patient has remained afebrile during ED visit and denies any fever at the group home.  BMP showed no electrolyte abnormality.  An x-ray was obtained of the right great toe for any bone involvement.  X-ray showed no evidence of bone involvement.  Patient is otherwise stable with stable and reassuring vital signs.  Sure decision making conversation with patient's mother about obtaining a prescription for Keflex at this time however waiting if symptoms worsen or if patient becomes febrile to treat with antibiotics.  Low suspicion for cellulitis, or other infection at this time.  Mother reports patient currently stays in a group home therefore she would like the copy of this prescription, I will print this prescription for mother and have her hold it.  She will be having patient reassessed by Dr. Harrington Challenger his PCP on the upcoming week.  Patient otherwise stable, afebrile stable for discharge at this time.  Portions of this note were generated with [employer].  Dictation errors may occur despite best attempts at proofreading.    Final Clinical Impressions(s) / ED Diagnoses   Final diagnoses:  Nail absent  Infection of nailbed of toe of right foot    ED Discharge Orders         Ordered    cephALEXin (KEFLEX) 500 MG capsule  4 times daily     08/18/18 2149           Janeece Fitting, PA-C 08/18/18 2157    Janeece Fitting, PA-C 08/18/18 2158    Tegeler, Gwenyth Allegra, MD 08/19/18 [phone]

## 2018-08-30 DIAGNOSIS — F0632 Mood disorder due to known physiological condition with major depressive-like episode: Secondary | ICD-10-CM | POA: Diagnosis not present

## 2018-09-07 DIAGNOSIS — R319 Hematuria, unspecified: Secondary | ICD-10-CM | POA: Diagnosis not present

## 2018-10-02 DIAGNOSIS — F71 Moderate intellectual disabilities: Secondary | ICD-10-CM | POA: Diagnosis not present

## 2018-10-02 DIAGNOSIS — F0632 Mood disorder due to known physiological condition with major depressive-like episode: Secondary | ICD-10-CM | POA: Diagnosis not present

## 2018-10-03 DIAGNOSIS — F0632 Mood disorder due to known physiological condition with major depressive-like episode: Secondary | ICD-10-CM | POA: Diagnosis not present

## 2018-10-20 DIAGNOSIS — K219 Gastro-esophageal reflux disease without esophagitis: Secondary | ICD-10-CM | POA: Diagnosis not present

## 2018-10-20 DIAGNOSIS — K2211 Ulcer of esophagus with bleeding: Secondary | ICD-10-CM | POA: Diagnosis not present

## 2018-10-26 DIAGNOSIS — R310 Gross hematuria: Secondary | ICD-10-CM | POA: Diagnosis not present

## 2018-11-02 DIAGNOSIS — F0632 Mood disorder due to known physiological condition with major depressive-like episode: Secondary | ICD-10-CM | POA: Diagnosis not present

## 2018-11-15 DIAGNOSIS — L720 Epidermal cyst: Secondary | ICD-10-CM | POA: Diagnosis not present

## 2018-12-04 DIAGNOSIS — F0632 Mood disorder due to known physiological condition with major depressive-like episode: Secondary | ICD-10-CM | POA: Diagnosis not present

## 2018-12-18 DIAGNOSIS — K219 Gastro-esophageal reflux disease without esophagitis: Secondary | ICD-10-CM | POA: Diagnosis not present

## 2018-12-18 DIAGNOSIS — F3181 Bipolar II disorder: Secondary | ICD-10-CM | POA: Diagnosis not present

## 2018-12-18 DIAGNOSIS — Z0000 Encounter for general adult medical examination without abnormal findings: Secondary | ICD-10-CM | POA: Diagnosis not present

## 2019-01-04 DIAGNOSIS — F0632 Mood disorder due to known physiological condition with major depressive-like episode: Secondary | ICD-10-CM | POA: Diagnosis not present

## 2019-02-06 DIAGNOSIS — F0632 Mood disorder due to known physiological condition with major depressive-like episode: Secondary | ICD-10-CM | POA: Diagnosis not present

## 2019-03-08 DIAGNOSIS — F0632 Mood disorder due to known physiological condition with major depressive-like episode: Secondary | ICD-10-CM | POA: Diagnosis not present

## 2019-04-02 DIAGNOSIS — F0632 Mood disorder due to known physiological condition with major depressive-like episode: Secondary | ICD-10-CM | POA: Diagnosis not present

## 2019-04-02 DIAGNOSIS — F71 Moderate intellectual disabilities: Secondary | ICD-10-CM | POA: Diagnosis not present

## 2019-04-09 DIAGNOSIS — F0632 Mood disorder due to known physiological condition with major depressive-like episode: Secondary | ICD-10-CM | POA: Diagnosis not present

## 2019-05-08 DIAGNOSIS — F0632 Mood disorder due to known physiological condition with major depressive-like episode: Secondary | ICD-10-CM | POA: Diagnosis not present

## 2019-06-07 DIAGNOSIS — M545 Low back pain: Secondary | ICD-10-CM | POA: Diagnosis not present

## 2019-06-12 DIAGNOSIS — F0632 Mood disorder due to known physiological condition with major depressive-like episode: Secondary | ICD-10-CM | POA: Diagnosis not present

## 2019-06-28 DIAGNOSIS — L720 Epidermal cyst: Secondary | ICD-10-CM | POA: Diagnosis not present

## 2019-07-10 DIAGNOSIS — F0632 Mood disorder due to known physiological condition with major depressive-like episode: Secondary | ICD-10-CM | POA: Diagnosis not present

## 2019-08-13 DIAGNOSIS — F0632 Mood disorder due to known physiological condition with major depressive-like episode: Secondary | ICD-10-CM | POA: Diagnosis not present

## 2019-09-12 DIAGNOSIS — F0632 Mood disorder due to known physiological condition with major depressive-like episode: Secondary | ICD-10-CM | POA: Diagnosis not present

## 2019-09-18 ENCOUNTER — Encounter: Payer: Self-pay | Admitting: Plastic Surgery

## 2019-09-18 ENCOUNTER — Other Ambulatory Visit: Payer: Self-pay

## 2019-09-18 ENCOUNTER — Ambulatory Visit (INDEPENDENT_AMBULATORY_CARE_PROVIDER_SITE_OTHER): Payer: Medicare Other | Admitting: Plastic Surgery

## 2019-09-18 DIAGNOSIS — L723 Sebaceous cyst: Secondary | ICD-10-CM | POA: Insufficient documentation

## 2019-09-18 NOTE — Progress Notes (Signed)
Patient ID: Spencer Cummings, male    DOB: 1975-11-13, 44 y.o.   MRN: [id number]   Chief Complaint  Patient presents with  . Consult    cyst anterior of the cervical neck  . Skin Problem    The patient is a 44 year old male with autism.  He is here with mom for evaluation of a growing lesion on the left anterior neck.  He has had sebaceous cysts before.  Having them removed in the office can be quite difficult.  Mom is requesting this be done in the OR.  I think that is very reasonable.  There is no sign of infection.  The lesion is about 3 cm in size.  It is firm and not movable but not adherent to underlying tissue.  It is on the left anterior neck.  Nothing makes it better.  It has gotten larger with time.   Review of Systems  Constitutional: Negative for activity change.  Eyes: Negative.   Respiratory: Negative.  Negative for chest tightness.   Cardiovascular: Negative.   Endocrine: Negative.   Genitourinary: Negative.   Musculoskeletal: Negative.   Hematological: Negative.   Psychiatric/Behavioral: Negative.     Past Medical History:  Diagnosis Date  . Bipolar disorder ([place])   . Cognitive developmental delay     Past Surgical History:  Procedure Laterality Date  . ESOPHAGOGASTRODUODENOSCOPY N/A 08/03/2017   Procedure: ESOPHAGOGASTRODUODENOSCOPY (EGD);  Surgeon: Laurence Spates, MD;  Location: [facility];  Service: Endoscopy;  Laterality: N/A;  . LAPAROSCOPIC CHOLECYSTECTOMY  2011      Current Outpatient Medications:  .  cloZAPine (CLOZARIL) 100 MG tablet, Take 400 mg by mouth every evening., Disp: , Rfl:  .  Docusate Sodium (COLACE PO), Take by mouth., Disp: , Rfl:  .  ondansetron (ZOFRAN) 4 MG tablet, Take 1 tablet (4 mg total) by mouth every 6 (six) hours as needed for nausea., Disp: 20 tablet, Rfl: 0 .  pantoprazole (PROTONIX) 40 MG tablet, Take 1 tablet (40 mg total) by mouth 2 (two) times daily., Disp: 60 tablet, Rfl: 1 .  polyethylene glycol (MIRALAX / GLYCOLAX)  packet, Take 17 g by mouth daily as needed., Disp: 14 each, Rfl: 0 .  sucralfate (CARAFATE) 1 GM/10ML suspension, Take 10 mLs (1 g total) by mouth 4 (four) times daily -  with meals and at bedtime for 7 days., Disp: 280 mL, Rfl: 0   Objective:   Vitals:   09/18/19 1433  BP: 111/73  Pulse: (!) 109  Temp: 98.7 F (37.1 C)  SpO2: 98%    Physical Exam Vitals and nursing note reviewed.  Constitutional:      Appearance: Normal appearance.  HENT:     Head: Normocephalic and atraumatic.  Neck:   Cardiovascular:     Rate and Rhythm: Normal rate.     Pulses: Normal pulses.  Pulmonary:     Effort: Pulmonary effort is normal. No respiratory distress.  Abdominal:     General: Abdomen is flat. There is no distension.  Skin:    General: Skin is warm.     Capillary Refill: Capillary refill takes less than 2 seconds.  Neurological:     General: No focal deficit present.     Mental Status: He is alert.  Psychiatric:        Mood and Affect: Mood normal.     Assessment & Plan:  Sebaceous cyst  Recommend excision of left anterior neck mass, most likely sebaceous cyst. We will plan to  do this at the outpatient surgery center.  [place], DO

## 2019-09-28 ENCOUNTER — Telehealth: Payer: Self-pay | Admitting: Plastic Surgery

## 2019-09-28 NOTE — Telephone Encounter (Signed)
Working on scheduling procedure, will need to be closer to the end of an OR day, since we will have to treat the case as a positive covid test until the rapid results are back. Spencer Cummings, per Larkin Ina at [facility], will be able to be sedated for covid test due to medical history.

## 2019-10-02 ENCOUNTER — Telehealth: Payer: Self-pay | Admitting: Plastic Surgery

## 2019-10-02 NOTE — Telephone Encounter (Signed)
Patient's mother would like to hold off for now with the surgery for Spencer Cummings. She will call back when she is ready to proceed.

## 2019-11-14 DIAGNOSIS — F0632 Mood disorder due to known physiological condition with major depressive-like episode: Secondary | ICD-10-CM | POA: Diagnosis not present

## 2019-12-07 ENCOUNTER — Encounter (HOSPITAL_COMMUNITY): Payer: Self-pay

## 2019-12-07 ENCOUNTER — Other Ambulatory Visit: Payer: Self-pay

## 2019-12-07 ENCOUNTER — Inpatient Hospital Stay (HOSPITAL_COMMUNITY)
Admission: EM | Admit: 2019-12-07 | Discharge: 2019-12-09 | DRG: 382 | Disposition: A | Discharge status: Home / Self Care | Payer: Medicare Other | Attending: Family Medicine | Admitting: Family Medicine

## 2019-12-07 DIAGNOSIS — K2100 Gastro-esophageal reflux disease with esophagitis, without bleeding: Secondary | ICD-10-CM | POA: Diagnosis present

## 2019-12-07 DIAGNOSIS — Z79899 Other long term (current) drug therapy: Secondary | ICD-10-CM | POA: Diagnosis not present

## 2019-12-07 DIAGNOSIS — Z9049 Acquired absence of other specified parts of digestive tract: Secondary | ICD-10-CM

## 2019-12-07 DIAGNOSIS — K920 Hematemesis: Secondary | ICD-10-CM | POA: Diagnosis present

## 2019-12-07 DIAGNOSIS — K922 Gastrointestinal hemorrhage, unspecified: Secondary | ICD-10-CM | POA: Diagnosis present

## 2019-12-07 DIAGNOSIS — K2211 Ulcer of esophagus with bleeding: Principal | ICD-10-CM | POA: Diagnosis present

## 2019-12-07 DIAGNOSIS — Z20822 Contact with and (suspected) exposure to covid-19: Secondary | ICD-10-CM | POA: Diagnosis present

## 2019-12-07 DIAGNOSIS — R6250 Unspecified lack of expected normal physiological development in childhood: Secondary | ICD-10-CM | POA: Diagnosis present

## 2019-12-07 DIAGNOSIS — F319 Bipolar disorder, unspecified: Secondary | ICD-10-CM | POA: Diagnosis present

## 2019-12-07 DIAGNOSIS — R000 Tachycardia, unspecified: Secondary | ICD-10-CM

## 2019-12-07 LAB — CBC WITH DIFFERENTIAL/PLATELET
Abs Immature Granulocytes: 0.09 10*3/uL — ABNORMAL HIGH (ref 0.00–0.07)
Basophils Absolute: 0.1 10*3/uL (ref 0.0–0.1)
Basophils Relative: 0 %
Eosinophils Absolute: 0 10*3/uL (ref 0.0–0.5)
Eosinophils Relative: 0 %
HCT: 54.6 % — ABNORMAL HIGH (ref 39.0–52.0)
Hemoglobin: 17.4 g/dL — ABNORMAL HIGH (ref 13.0–17.0)
Immature Granulocytes: 1 %
Lymphocytes Relative: 10 %
Lymphs Abs: 1.7 10*3/uL (ref 0.7–4.0)
MCH: 28.9 pg (ref 26.0–34.0)
MCHC: 31.9 g/dL (ref 30.0–36.0)
MCV: 90.5 fL (ref 80.0–100.0)
Monocytes Absolute: 1.1 10*3/uL — ABNORMAL HIGH (ref 0.1–1.0)
Monocytes Relative: 6 %
Neutro Abs: 14.7 10*3/uL — ABNORMAL HIGH (ref 1.7–7.7)
Neutrophils Relative %: 83 %
Platelets: 307 10*3/uL (ref 150–400)
RBC: 6.03 MIL/uL — ABNORMAL HIGH (ref 4.22–5.81)
RDW: 15.1 % (ref 11.5–15.5)
WBC: 17.7 10*3/uL — ABNORMAL HIGH (ref 4.0–10.5)
nRBC: 0 % (ref 0.0–0.2)

## 2019-12-07 LAB — COMPREHENSIVE METABOLIC PANEL
ALT: 34 U/L (ref 0–44)
AST: 27 U/L (ref 15–41)
Albumin: 4.4 g/dL (ref 3.5–5.0)
Alkaline Phosphatase: 89 U/L (ref 38–126)
Anion gap: 14 (ref 5–15)
BUN: 26 mg/dL — ABNORMAL HIGH (ref 6–20)
CO2: 21 mmol/L — ABNORMAL LOW (ref 22–32)
Calcium: 10.3 mg/dL (ref 8.9–10.3)
Chloride: 103 mmol/L (ref 98–111)
Creatinine, Ser: 1.15 mg/dL (ref 0.61–1.24)
GFR calc Af Amer: >60 mL/min (ref >60)
GFR calc non Af Amer: >60 mL/min (ref >60)
Glucose, Bld: 168 mg/dL — ABNORMAL HIGH (ref 70–99)
Potassium: 3.8 mmol/L (ref 3.5–5.1)
Sodium: 138 mmol/L (ref 135–145)
Total Bilirubin: 0.8 mg/dL (ref 0.3–1.2)
Total Protein: 8.3 g/dL — ABNORMAL HIGH (ref 6.5–8.1)

## 2019-12-07 LAB — SARS CORONAVIRUS 2 BY RT PCR (HOSPITAL ORDER, PERFORMED IN ~~LOC~~ HOSPITAL LAB): SARS Coronavirus 2: NEGATIVE

## 2019-12-07 LAB — HEMOGLOBIN AND HEMATOCRIT, BLOOD
HCT: 51.3 % (ref 39.0–52.0)
Hemoglobin: 16.6 g/dL (ref 13.0–17.0)

## 2019-12-07 LAB — TYPE AND SCREEN
ABO/RH(D): O POS
Antibody Screen: NEGATIVE

## 2019-12-07 LAB — LIPASE, BLOOD: Lipase: 25 U/L (ref 11–51)

## 2019-12-07 MED ORDER — SODIUM CHLORIDE 0.9 % IV BOLUS
1000.0000 mL | Freq: Once | INTRAVENOUS | Status: AC
  Administered 2019-12-07: 1000 mL via INTRAVENOUS

## 2019-12-07 MED ORDER — CLOZAPINE 100 MG PO TABS
400.0000 mg | ORAL_TABLET | Freq: Every evening | ORAL | Status: DC
  Administered 2019-12-07 – 2019-12-08 (×2): 400 mg via ORAL
  Filled 2019-12-07 (×3): qty 4

## 2019-12-07 MED ORDER — ONDANSETRON HCL 4 MG/2ML IJ SOLN: 4.0000 mg | Freq: Four times a day (QID) | INTRAMUSCULAR | Status: DC | PRN

## 2019-12-07 MED ORDER — PANTOPRAZOLE SODIUM 40 MG IV SOLR
40.0000 mg | Freq: Once | INTRAVENOUS | Status: AC
  Administered 2019-12-07: 40 mg via INTRAVENOUS
  Filled 2019-12-07: qty 40

## 2019-12-07 MED ORDER — SODIUM CHLORIDE 0.9 % IV SOLN
8.0000 mg/h | INTRAVENOUS | Status: DC
  Administered 2019-12-08 – 2019-12-09 (×4): 8 mg/h via INTRAVENOUS
  Filled 2019-12-07 (×8): qty 80

## 2019-12-07 MED ORDER — SODIUM CHLORIDE 0.9 % IV SOLN: INTRAVENOUS | Status: DC

## 2019-12-07 MED ORDER — ONDANSETRON HCL 4 MG PO TABS: 4.0000 mg | ORAL_TABLET | Freq: Four times a day (QID) | ORAL | Status: DC | PRN

## 2019-12-07 MED ORDER — LORAZEPAM 2 MG/ML IJ SOLN
1.0000 mg | Freq: Once | INTRAMUSCULAR | Status: AC
  Administered 2019-12-07: 1 mg via INTRAVENOUS
  Filled 2019-12-07: qty 1

## 2019-12-07 NOTE — ED Provider Notes (Signed)
[facility] EMERGENCY DEPARTMENT Provider Note   CSN: [id number] Arrival date & time: 12/07/19  1730     History Chief Complaint  Patient presents with  . Nausea    Spencer Cummings is a 44 y.o. male presenting for evaluation of hematemesis.  Level 5 caveat due to MR.  History provided by patient's mom.  Mom states patient had vomiting this morning at 6 AM.  He had another episode at 3 PM.  Mom reports emesis was found to be dark brown, coffee-ground like.  He has a history of similar in 2019, was found to have severe esophagitis at that time.  He follows with Dr. Oletta Lamas with [facility].  Patient stays in a group home.  Mom denies any knowledge of fevers, pain, cough, shortness of breath, urinary symptoms, abnormal bowel movements.  Patient is not on blood thinners.  He takes Protonix daily, as far she knows he had his medication today.  Patient has not had his flu vaccine.  Additional history obtained from triage RN.  Patient had multiple rounds of coffee-ground emesis in the triage room.    HPI     Past Medical History:  Diagnosis Date  . Bipolar disorder ([place])   . Cognitive developmental delay     Patient Active Problem List   Diagnosis Date Noted  . Upper GI bleeding 12/07/2019  . Tachycardia 12/07/2019  . Developmental delay, profound 12/07/2019  . Sebaceous cyst 09/18/2019  . Hypokalemia 08/03/2017  . Leucocytosis 08/03/2017  . Hematemesis 08/02/2017    Past Surgical History:  Procedure Laterality Date  . ESOPHAGOGASTRODUODENOSCOPY N/A 08/03/2017   Procedure: ESOPHAGOGASTRODUODENOSCOPY (EGD);  Surgeon: Laurence Spates, MD;  Location: [facility];  Service: Endoscopy;  Laterality: N/A;  . LAPAROSCOPIC CHOLECYSTECTOMY  2011       Family History  Problem Relation Age of Onset  . GER disease Mother   . GER disease Father   . GI Bleed Neg Hx        Some question if patients great grandfather may have had GI Bleed, but not sure.    Social  History   Tobacco Use  . Smoking status: Never Smoker  . Smokeless tobacco: Never Used  Vaping Use  . Vaping Use: Never used  Substance Use Topics  . Alcohol use: Never  . Drug use: Never    Home Medications Prior to Admission medications   Medication Sig Start Date End Date Taking? Authorizing Provider  cloZAPine (CLOZARIL) 100 MG tablet Take 400 mg by mouth every evening. 07/12/17  Yes [provider]  Docusate Sodium (COLACE PO) Take 1 tablet by mouth in the morning, at noon, and at bedtime.    Yes [provider]  pantoprazole (PROTONIX) 40 MG tablet Take 1 tablet (40 mg total) by mouth 2 (two) times daily. Patient taking differently: Take 40 mg by mouth daily.  08/06/17  Yes Bonnielee Haff, MD  ondansetron (ZOFRAN) 4 MG tablet Take 1 tablet (4 mg total) by mouth every 6 (six) hours as needed for nausea. Patient not taking: Reported on 12/07/2019 08/06/17   Bonnielee Haff, MD  polyethylene glycol [facility] / Floria Raveling) packet Take 17 g by mouth daily as needed. Patient not taking: Reported on 12/07/2019 08/06/17   Bonnielee Haff, MD  sucralfate (CARAFATE) 1 GM/10ML suspension Take 10 mLs (1 g total) by mouth 4 (four) times daily -  with meals and at bedtime for 7 days. Patient not taking: Reported on 12/07/2019 08/06/17 08/13/17  Bonnielee Haff, MD  Allergies    Lamictal [lamotrigine], Mellaril [thioridazine], and Zoloft [sertraline hcl]  Review of Systems   Review of Systems  Unable to perform ROS: Other  Gastrointestinal: Positive for vomiting.  Hematological: Does not bruise/bleed easily.   Level V caveat due to MR.   Physical Exam Updated Vital Signs BP (!) 140/109   Pulse (!) 113   Temp 97.8 F (36.6 C) (Oral)   Resp 15   SpO2 95%   Physical Exam Vitals and nursing note reviewed.  Constitutional:      General: He is not in acute distress.    Appearance: He is well-developed.     Comments: Appears nontoxic  HENT:     Head: Normocephalic and  atraumatic.  Eyes:     Conjunctiva/sclera: Conjunctivae normal.     Pupils: Pupils are equal, round, and reactive to light.  Cardiovascular:     Rate and Rhythm: Normal rate and regular rhythm.     Pulses: Normal pulses.  Pulmonary:     Effort: Pulmonary effort is normal. No respiratory distress.     Breath sounds: Normal breath sounds. No wheezing.  Abdominal:     General: There is no distension.     Palpations: Abdomen is soft. There is no mass.     Tenderness: There is no abdominal tenderness. There is no guarding or rebound.     Comments: No ttp of the abd. no rigidity, guarding, distention.  Negative rebound.  No peritonitis.  Musculoskeletal:        General: Normal range of motion.     Cervical back: Normal range of motion and neck supple.  Skin:    General: Skin is warm and dry.     Capillary Refill: Capillary refill takes less than 2 seconds.  Neurological:     Mental Status: He is alert and oriented to person, place, and time.     ED Results / Procedures / Treatments   Labs (all labs ordered are listed, but only abnormal results are displayed) Labs Reviewed  COMPREHENSIVE METABOLIC PANEL - Abnormal; Notable for the following components:      Result Value   CO2 21 (*)    Glucose, Bld 168 (*)    BUN 26 (*)    Total Protein 8.3 (*)    All other components within normal limits  CBC WITH DIFFERENTIAL/PLATELET - Abnormal; Notable for the following components:   WBC 17.7 (*)    RBC 6.03 (*)    Hemoglobin 17.4 (*)    HCT 54.6 (*)    Neutro Abs 14.7 (*)    Monocytes Absolute 1.1 (*)    Abs Immature Granulocytes 0.09 (*)    All other components within normal limits  SARS CORONAVIRUS 2 BY RT PCR (HOSPITAL ORDER, [facility])  LIPASE, BLOOD  HIV ANTIBODY (ROUTINE TESTING W REFLEX)  BASIC METABOLIC PANEL  CBC  HEMOGLOBIN AND HEMATOCRIT, BLOOD  TYPE AND SCREEN    EKG None  Radiology No results found.  Procedures Procedures (including  critical care time)  Medications Ordered in ED Medications  cloZAPine (CLOZARIL) tablet 400 mg (has no administration in time range)  pantoprazole (PROTONIX) 80 mg in sodium chloride 0.9 % 100 mL (0.8 mg/mL) infusion (has no administration in time range)  0.9 %  sodium chloride infusion (has no administration in time range)  ondansetron (ZOFRAN) tablet 4 mg (has no administration in time range)    Or  ondansetron (ZOFRAN) injection 4 mg (has no administration in  time range)  pantoprazole (PROTONIX) injection 40 mg (40 mg Intravenous Given 12/07/19 1821)  LORazepam (ATIVAN) injection 1 mg (1 mg Intravenous Given 12/07/19 1821)  sodium chloride 0.9 % bolus 1,000 mL (0 mLs Intravenous Stopped 12/07/19 2050)    ED Course  I have reviewed the triage vital signs and the nursing notes.  Pertinent labs & imaging results that were available during my care of the patient were reviewed by me and considered in my medical decision making (see chart for details).    MDM Rules/Calculators/A&P                          Patient presented for evaluation of hematemesis.  On exam, patient appears nontoxic.  He has no abdominal tenderness, however history and physical less reliable due to patient's MR.  He is not on blood thinners, however does have a history of previous hematemesis due to severe esophagitis.  He will likely need EGD in the hospital due to active hematemesis in triage. protonix IV given. Will check labs including hgb.   Hemoglobin stable at 17.4.  BUN mildly elevated, consistent with bleed.  As patient does not have abdominal tenderness or fever, I do not believe he needs CT abdomen pelvis events have low suspicion for perfect.  He is mildly tachycardic, though baseline appears close to 100.  Will give fluid. Per mom, pt will need sedation for covid test. As such, ativan ordered for nausea and sedation.   Case discussed with Dr. Tonie Griffith from [facility] hospitalist service, pt to be admitted.   Gi  consult placed and D. Ganem added to treatment team for rounding tomorrow AM.    Final Clinical Impression(s) / ED Diagnoses Final diagnoses:  Hematemesis, presence of nausea not specified    Rx / DC Orders ED Discharge Orders    None       Franchot Heidelberg, PA-C 12/07/19 2112    Virgel Manifold, MD 12/07/19 2304

## 2019-12-07 NOTE — H&P (Signed)
History and Physical    Spencer Cummings WTU:[id number] DOB: January 24, 1976 DOA: 12/07/2019  PCP: Lawerance Cruel, MD   Patient coming from:  Group Home  Chief Complaint:  Coffee ground emesis, black stool  HPI: Spencer Cummings is a 44 y.o. male with medical history significant for developmental delay and history of upper GI bleeding.  Patient presents from local group home.  Mother is at bedside who is his guardian.  She reports that the group home called her around 3 PM this afternoon stating that [name] had dark coffee ground emesis.  They then reported he also had an episode of coffee-ground emesis around 6 AM today.  He also had very black tarry stool this afternoon.  Mother went to the group home and witnessed an episode of coffee-ground emesis and states that look like the vomit he had when he had an upper GI bleed in 2019.  He is on Protonix daily which he got this morning at the group home.  Patient is unable to provide any history secondary to his profound developmental delay.  There is no report of fever, chills, change in appetite, abdominal trauma, cough, urinary frequency.  ED Course: Patient was given fluid bolus in the emergency room secondary to tachycardia and upper GI bleed.  He is also given IV Protonix once.  His hemoglobin is stable at 17.6 but was only checked 1 time in the emergency room.  Patient does have some tachycardia but otherwise hemodynamically stable.  Review of Systems:  Accurate review of systems cannot be obtained from patient secondary to profound developmental delay  Past Medical History:  Diagnosis Date  . Bipolar disorder ([place])   . Cognitive developmental delay     Past Surgical History:  Procedure Laterality Date  . ESOPHAGOGASTRODUODENOSCOPY N/A 08/03/2017   Procedure: ESOPHAGOGASTRODUODENOSCOPY (EGD);  Surgeon: Laurence Spates, MD;  Location: [facility];  Service: Endoscopy;  Laterality: N/A;  . LAPAROSCOPIC CHOLECYSTECTOMY  2011    Social  History  reports that he has never smoked. He has never used smokeless tobacco. He reports that he does not drink alcohol and does not use drugs.  Allergies  Allergen Reactions  . Lamictal [Lamotrigine] Rash    Tongue thrush, burning, swelling, elbow eczema, sores on knee, finger, wrist, shinn and foot in 2004  . Mellaril [Thioridazine] Other (See Comments)    Neuroleptic malignant syndrome  . Zoloft [Sertraline Hcl] Other (See Comments)    Uncontrolled salvia, weak, limber - occurred very quickly after getting this medication    Family History  Problem Relation Age of Onset  . GER disease Mother   . GER disease Father   . GI Bleed Neg Hx        Some question if patients great grandfather may have had GI Bleed, but not sure.     Prior to Admission medications   Medication Sig Start Date End Date Taking? Authorizing Provider  cloZAPine (CLOZARIL) 100 MG tablet Take 400 mg by mouth every evening. 07/12/17   [provider]  Docusate Sodium (COLACE PO) Take by mouth.    [provider]  ondansetron (ZOFRAN) 4 MG tablet Take 1 tablet (4 mg total) by mouth every 6 (six) hours as needed for nausea. 08/06/17   Bonnielee Haff, MD  pantoprazole (PROTONIX) 40 MG tablet Take 1 tablet (40 mg total) by mouth 2 (two) times daily. 08/06/17   Bonnielee Haff, MD  polyethylene glycol [facility] / Floria Raveling) packet Take 17 g by mouth daily as needed. 08/06/17  Bonnielee Haff, MD  sucralfate (CARAFATE) 1 GM/10ML suspension Take 10 mLs (1 g total) by mouth 4 (four) times daily -  with meals and at bedtime for 7 days. 08/06/17 08/13/17  Bonnielee Haff, MD    Physical Exam: Vitals:   12/07/19 1809 12/07/19 1812 12/07/19 1812 12/07/19 1900  BP:    130/87  Pulse:  (!) 124  (!) 117  Resp: 16 16  19   Temp:   97.8 F (36.6 C)   TempSrc:   Oral   SpO2:  95%  95%    Constitutional: NAD, calm, comfortable Vitals:   12/07/19 1809 12/07/19 1812 12/07/19 1812 12/07/19 1900  BP:    130/87   Pulse:  (!) 124  (!) 117  Resp: 16 16  19   Temp:   97.8 F (36.6 C)   TempSrc:   Oral   SpO2:  95%  95%   General: WDWN, Alert.  Oriented to self Eyes: EOMI, PERRL, lids and conjunctivae normal.  Sclera nonicteric HENT:  [place]/AT, external ears normal.  Nares patent without epistasis.  Mucous membranes are moist. Posterior pharynx clear of any exudate or lesions. Neck: Soft, normal range of motion, supple, no masses, no thyromegaly.  Trachea midline Respiratory: clear to auscultation bilaterally, no wheezing, no crackles. Normal respiratory effort. No accessory muscle use.  Cardiovascular: Sinus tachycardia, no murmurs / rubs / gallops. No extremity edema. 2+ pedal pulses Abdomen: Soft, no tenderness, nondistended, no rebound or guarding.  No masses palpated.  Bowel sounds normoactive Musculoskeletal: FROM. no clubbing / cyanosis. No joint deformity upper and lower extremities. no contractures. Normal muscle tone.  Skin: Warm, dry, intact no rashes, lesions, ulcers. No induration Neurologic: CN 2-12 grossly intact.  Sensation intact, patella DTR +1 bilaterally. Strength 5/5 in all extremities.      Labs on Admission: I have personally reviewed following labs and imaging studies  CBC: Recent Labs  Lab 12/07/19 1822  WBC 17.7*  NEUTROABS 14.7*  HGB 17.4*  HCT 54.6*  MCV 90.5  PLT 096    Basic Metabolic Panel: Recent Labs  Lab 12/07/19 1822  NA 138  K 3.8  CL 103  CO2 21*  GLUCOSE 168*  BUN 26*  CREATININE 1.15  CALCIUM 10.3    GFR: CrCl cannot be calculated (Unknown ideal weight.).  Liver Function Tests: Recent Labs  Lab 12/07/19 1822  AST 27  ALT 34  ALKPHOS 89  BILITOT 0.8  PROT 8.3*  ALBUMIN 4.4    Urine analysis:    Component Value Date/Time   COLORURINE AMBER (A) 08/02/2017 1741   APPEARANCEUR CLEAR 08/02/2017 1741   LABSPEC 1.028 08/02/2017 1741   PHURINE 5.0 08/02/2017 1741   GLUCOSEU NEGATIVE 08/02/2017 1741   HGBUR NEGATIVE 08/02/2017 1741    BILIRUBINUR NEGATIVE 08/02/2017 1741   KETONESUR 5 (A) 08/02/2017 1741   PROTEINUR 30 (A) 08/02/2017 1741   NITRITE NEGATIVE 08/02/2017 1741   LEUKOCYTESUR NEGATIVE 08/02/2017 1741    Radiological Exams on Admission: No results found.   Assessment/Plan Principal Problem:   Upper GI bleeding Mr. Roepke is admitted to progressive care unit for close monitoring with upper GI bleed.  Will obtain serial hemoglobin hematocrit levels overnight. Patient placed on Protonix infusion. ER providers placed a consult to gastroenterology for them to see patient in the morning as he is currently hemodynamically stable. If patient has further hematemesis or deterioration in his condition will call gastroenterology for evaluation on a more urgent basis overnight. Antiemetics provided.  Active Problems:  Hematemesis  Protonix provided.  Patient has a history of upper GI bleeding.  Gastroenterology consulted.    Tachycardia Patient given fluid bolus in the emergency room.  Continue IV fluid hydration with normal saline 100 mL/h.  Monitor    Developmental delay, profound Chronic    DVT prophylaxis: SCDs for DVT prophylaxis with acute upper GI bleeding Code Status:   Full code Family Communication:  Diagnosis plan discussed with patient's mother who is his guardian who is at the bedside.  Questions were answered.  She verbalized understanding and agrees with plan.  Further recommendations to follow as clinical indicated Disposition Plan:   Patient is from:  Group home  Anticipated DC to:  Group home  Anticipated DC date:  Anticipate more than 2 midnight stay in the hospital to treat and stabilize medical condition  Anticipated DC barriers: No barriers to discharge identified at this time  Consults called:  Gastroenterology Admission status:  Inpatient  Severity of Illness: The appropriate patient status for this patient is INPATIENT. Inpatient status is judged to be reasonable and  necessary in order to provide the required intensity of service to ensure the patient's safety. The patient's presenting symptoms, physical exam findings, and initial radiographic and laboratory data in the context of their chronic comorbidities is felt to place them at high risk for further clinical deterioration. Furthermore, it is not anticipated that the patient will be medically stable for discharge from the hospital within 2 midnights of admission. The following factors support the patient status of inpatient.   " The patient's presenting symptoms include for ground emesis. " Physical exam is unremarkable at this time for mild tachycardia. " The initial radiographic and laboratory data showed normal hemoglobin and hematocrit at this time.  We will check serial H&H levels " The chronic co-morbidities include history of upper GI bleed.  Developmental delay   * I certify that at the point of admission it is my clinical judgment that the patient will require inpatient hospital care spanning beyond 2 midnights from the point of admission due to high intensity of service, high risk for further deterioration and high frequency of surveillance required.Yevonne Aline Telisha Zawadzki MD [facility]  How to contact the [facility] Attending or Consulting provider [place] or covering provider during after hours [place], for this patient?   1. Check the care team in [facility] and look for a) attending/consulting [facility] provider listed and b) the [facility] team listed 2. Log into [email or web address] and use [place]'s universal password to access. If you do not have the password, please contact the hospital operator. 3. Locate the [facility] provider you are looking for under [facility] and page to a number that you can be directly reached. 4. If you still have difficulty reaching the provider, please page the [facility] (Director on Call) for the Hospitalists listed on amion for assistance.  12/07/2019, 8:27 PM

## 2019-12-07 NOTE — ED Triage Notes (Addendum)
Pt arrives to ED w/ c/o N/V/D that started 0600 this morning. Pt vomiting dark brown, coffee ground emesis. Pt is mentally handicapped.

## 2019-12-08 ENCOUNTER — Other Ambulatory Visit: Payer: Self-pay

## 2019-12-08 LAB — CBC
HCT: 49 % (ref 39.0–52.0)
Hemoglobin: 16.2 g/dL (ref 13.0–17.0)
MCH: 29.6 pg (ref 26.0–34.0)
MCHC: 33.1 g/dL (ref 30.0–36.0)
MCV: 89.4 fL (ref 80.0–100.0)
Platelets: 257 10*3/uL (ref 150–400)
RBC: 5.48 MIL/uL (ref 4.22–5.81)
RDW: 14.9 % (ref 11.5–15.5)
WBC: 18.1 10*3/uL — ABNORMAL HIGH (ref 4.0–10.5)
nRBC: 0 % (ref 0.0–0.2)

## 2019-12-08 LAB — BASIC METABOLIC PANEL
Anion gap: 15 (ref 5–15)
BUN: 25 mg/dL — ABNORMAL HIGH (ref 6–20)
CO2: 18 mmol/L — ABNORMAL LOW (ref 22–32)
Calcium: 9.6 mg/dL (ref 8.9–10.3)
Chloride: 106 mmol/L (ref 98–111)
Creatinine, Ser: 1.15 mg/dL (ref 0.61–1.24)
GFR calc Af Amer: >60 mL/min (ref >60)
GFR calc non Af Amer: >60 mL/min (ref >60)
Glucose, Bld: 146 mg/dL — ABNORMAL HIGH (ref 70–99)
Potassium: 4 mmol/L (ref 3.5–5.1)
Sodium: 139 mmol/L (ref 135–145)

## 2019-12-08 LAB — HEMOGLOBIN AND HEMATOCRIT, BLOOD
HCT: 46.2 % (ref 39.0–52.0)
HCT: 43.4 % (ref 39.0–52.0)
Hemoglobin: 15.1 g/dL (ref 13.0–17.0)
Hemoglobin: 13.9 g/dL (ref 13.0–17.0)

## 2019-12-08 LAB — HIV ANTIBODY (ROUTINE TESTING W REFLEX): HIV Screen 4th Generation wRfx: NONREACTIVE

## 2019-12-08 NOTE — Progress Notes (Signed)
PROGRESS NOTE    Spencer Cummings  MHD:[id number] DOB: [birth year] DOA: 12/07/2019 PCP: Lawerance Cruel, MD   Brief Narrative:  HPI: Spencer Cummings is a 44 y.o. male with medical history significant for developmental delay and history of upper GI bleeding.  Patient presents from local group home.  Mother is at bedside who is his guardian.  She reports that the group home called her around 3 PM this afternoon stating that Spencer Cummings had dark coffee ground emesis.  They then reported he also had an episode of coffee-ground emesis around 6 AM today.  He also had very black tarry stool this afternoon.  Mother went to the group home and witnessed an episode of coffee-ground emesis and states that look like the vomit he had when he had an upper GI bleed in 2019.  He is on Protonix daily which he got this morning at the group home.  Patient is unable to provide any history secondary to his profound developmental delay.  There is no report of fever, chills, change in appetite, abdominal trauma, cough, urinary frequency.  ED Course: Patient was given fluid bolus in the emergency room secondary to tachycardia and upper GI bleed.  He is also given IV Protonix once.  His hemoglobin is stable at 17.6 but was only checked 1 time in the emergency room.  Patient does have some tachycardia but otherwise hemodynamically stable.  Assessment & Plan:   Principal Problem:   Upper GI bleeding Active Problems:   Hematemesis   Tachycardia   Developmental delay, profound  Hematemesis/upper GI bleeding/diarrhea: Reportedly, patient had 3 episodes of hematemesis yesterday.  No history of melena.  Patient's hemoglobin was significantly elevated up to 17.4 upon presentation and it had dropped to 15.1 only.  Still well within normal range.  GI saw patient.  Since he has not had any further episodes and hemoglobin is significantly elevated still, the plan on holding off to any further EGD and monitor him overnight.  If no  further drop by tomorrow or no further episodes of hematemesis, he will likely be discharged home tomorrow.  Mother at the bedside had discussed in length with GI and she is in agreement.  Appreciate GI help.  Monitor H&H every 12 hours and continue PPI.  Patient has had 2 episodes of diarrhea this morning.  Although this could be due to upper GI bleed however mother concerned about infection.  Will check C. difficile.  Chronic profound developmental delay: Noted.  Patient in group home.  DVT prophylaxis: SCDs Start: 12/07/19 2039   Code Status: Full Code  Family Communication: Mother present at bedside, plan of care discussed in length with her and she verbalized understanding.  Status is: Inpatient  Remains inpatient appropriate because:Inpatient level of care appropriate due to severity of illness   Dispo: The patient is from: Group home              Anticipated d/c is to: Group home              Anticipated d/c date is: 1 day              Patient currently is not medically stable to d/c.        Estimated body mass index is 32.22 kg/m as calculated from the following:   Height as of 09/18/19: 5\' 8"  (1.727 m).   Weight as of this encounter: 96.1 kg.      Nutritional status:  Consultants:   GI  Procedures:   None  Antimicrobials:  Anti-infectives (From admission, onward)   None         Subjective: Patient seen and examined with mother at the bedside.  Patient looks comfortable.  Unable to talk much due to developmental delay.  Objective: Vitals:   12/08/19 0500 12/08/19 0509 12/08/19 0800 12/08/19 0832  BP:  109/75 133/86   Pulse:  (!) 102 (!) 113 (!) 108  Resp:  19 20 18   Temp: 98.8 F (37.1 C)  98.2 F (36.8 C) 98.2 F (36.8 C)  TempSrc: Oral     SpO2:  96% 96%   Weight:        Intake/Output Summary (Last 24 hours) at 12/08/2019 1243 Last data filed at 12/08/2019 0650 Gross per 24 hour  Intake 1770 ml  Output --  Net 1770  ml   Filed Weights   12/08/19 0100  Weight: 96.1 kg    Examination:  General exam: Appears calm and comfortable  Respiratory system: Clear to auscultation. Respiratory effort normal. Cardiovascular system: S1 & S2 heard, RRR. No JVD, murmurs, rubs, gallops or clicks. No pedal edema. Gastrointestinal system: Abdomen is nondistended, soft and nontender. No organomegaly or masses felt. Normal bowel sounds heard. Central nervous system: Alert. No focal neurological deficits. Extremities: Symmetric 5 x 5 power. Skin: No rashes, lesions or ulcers   Data Reviewed: I have personally reviewed following labs and imaging studies  CBC: Recent Labs  Lab 12/07/19 1822 12/07/19 2152 12/08/19 0443 12/08/19 1040  WBC 17.7*  --  18.1*  --   NEUTROABS 14.7*  --   --   --   HGB 17.4* 16.6 16.2 15.1  HCT 54.6* 51.3 49.0 46.2  MCV 90.5  --  89.4  --   PLT 307  --  257  --    Basic Metabolic Panel: Recent Labs  Lab 12/07/19 1822 12/08/19 0443  NA 138 139  K 3.8 4.0  CL 103 106  CO2 21* 18*  GLUCOSE 168* 146*  BUN 26* 25*  CREATININE 1.15 1.15  CALCIUM 10.3 9.6   GFR: Estimated Creatinine Clearance: 93.1 mL/min (by C-G formula based on SCr of 1.15 mg/dL). Liver Function Tests: Recent Labs  Lab 12/07/19 1822  AST 27  ALT 34  ALKPHOS 89  BILITOT 0.8  PROT 8.3*  ALBUMIN 4.4   Recent Labs  Lab 12/07/19 1822  LIPASE 25   No results for input(s): AMMONIA in the last 168 hours. Coagulation Profile: No results for input(s): INR, PROTIME in the last 168 hours. Cardiac Enzymes: No results for input(s): CKTOTAL, CKMB, CKMBINDEX, TROPONINI in the last 168 hours. BNP (last 3 results) No results for input(s): PROBNP in the last 8760 hours. HbA1C: No results for input(s): HGBA1C in the last 72 hours. CBG: No results for input(s): GLUCAP in the last 168 hours. Lipid Profile: No results for input(s): CHOL, HDL, LDLCALC, TRIG, CHOLHDL, LDLDIRECT in the last 72 hours. Thyroid  Function Tests: No results for input(s): TSH, T4TOTAL, FREET4, T3FREE, THYROIDAB in the last 72 hours. Anemia Panel: No results for input(s): VITAMINB12, FOLATE, FERRITIN, TIBC, IRON, RETICCTPCT in the last 72 hours. Sepsis Labs: No results for input(s): PROCALCITON, LATICACIDVEN in the last 168 hours.  Recent Results (from the past 240 hour(s))  SARS Coronavirus 2 by RT PCR (hospital order, performed in [facility] hospital lab) Nasopharyngeal Nasopharyngeal Swab     Status: None   Collection Time: 12/07/19  6:15 PM  Specimen: Nasopharyngeal Swab  Result Value Ref Range Status   SARS Coronavirus 2 NEGATIVE NEGATIVE Final    Comment: (NOTE) SARS-CoV-2 target nucleic acids are NOT DETECTED.  The SARS-CoV-2 RNA is generally detectable in upper and lower respiratory specimens during the acute phase of infection. The lowest concentration of SARS-CoV-2 viral copies this assay can detect is 250 copies / mL. A negative result does not preclude SARS-CoV-2 infection and should not be used as the sole basis for treatment or other patient management decisions.  A negative result may occur with improper specimen collection / handling, submission of specimen other than nasopharyngeal swab, presence of viral mutation(s) within the areas targeted by this assay, and inadequate number of viral copies (<250 copies / mL). A negative result must be combined with clinical observations, patient history, and epidemiological information.  Fact Sheet for Patients:   StrictlyIdeas.no  Fact Sheet for Healthcare Providers: BankingDealers.co.za  This test is not yet approved or  cleared by the [place] FDA and has been authorized for detection and/or diagnosis of SARS-CoV-2 by FDA under an Emergency Use Authorization (EUA).  This EUA will remain in effect (meaning this test can be used) for the duration of the COVID-19 declaration under Section 564(b)(1)  of the Act, 21 U.S.C. section 360bbb-3(b)(1), unless the authorization is terminated or revoked sooner.  Performed at [facility], [place] [address] [place], [place] [address]       Radiology Studies: No results found.  Scheduled Meds: . cloZAPine  400 mg Oral QPM   Continuous Infusions: . sodium chloride 100 mL/hr at 12/07/19 2345  . pantoprozole (PROTONIX) infusion 8 mg/hr (12/08/19 0829)     LOS: 1 day   Time spent: 33 minutes   Darliss Cheney, MD [facility]  12/08/2019, 12:43 PM   To contact the attending provider between 7A-7P or the covering provider during after hours 7P-7A, please log into the web site [email or web address].

## 2019-12-08 NOTE — Progress Notes (Signed)
Patient arrived to the unit with a mews score of Yellow. Monitoring every 4 hours.

## 2019-12-08 NOTE — Progress Notes (Signed)
   12/08/19 0800  Assess: MEWS Score  Temp 98.2 F (36.8 C)  BP 133/86  Pulse Rate (!) 113  ECG Heart Rate (!) 113  Resp 20  Level of Consciousness Alert  SpO2 96 %  O2 Device Room Air  Assess: MEWS Score  MEWS Temp 0  MEWS Systolic 0  MEWS Pulse 2  MEWS RR 0  MEWS LOC 0  MEWS Score 2  MEWS Score Color Yellow  Assess: if the MEWS score is Yellow or Red  Were vital signs taken at a resting state? No  Focused Assessment No change from prior assessment  Early Detection of Sepsis Score *See Row Information* Low  MEWS guidelines implemented *See Row Information* No, previously yellow, continue vital signs every 4 hours    Patient with increased HR while up to the bedside commode. Patient has had a elevated HR since admission. MD aware and IV fluids increased from 116ml/hr to 154ml/hr

## 2019-12-08 NOTE — Plan of Care (Signed)
  Problem: Education: Goal: Knowledge of General Education information will improve Description Including pain rating scale, medication(s)/side effects and non-pharmacologic comfort measures Outcome: Progressing   

## 2019-12-08 NOTE — Progress Notes (Signed)
NURSING PROGRESS NOTE  Spencer Cummings [id number] Admission Data: 12/08/2019 5:51 AM Attending Provider: Chotiner, Yevonne Aline, MD HUO:HFGB, Dwyane Luo, MD Code Status: Full   Spencer Cummings is a 44 y.o. male patient admitted from ED:  -No acute distress noted.  -No complaints of shortness of breath.  -No complaints of chest pain.   Cardiac Monitoring: Box # 17 in place. Cardiac monitor yields:sinus tachycardia.  Blood pressure 109/75, pulse (!) 102, temperature 98.8 F (37.1 C), temperature source Oral, resp. rate 19, weight 96.1 kg, SpO2 96 %.   IV Fluids:  IV in place, occlusive dsg intact without redness, IV cath antecubital left, condition patent and no redness none.   Allergies:  Lamictal [lamotrigine], Mellaril [thioridazine], and Zoloft [sertraline hcl]  Past Medical History:   has a past medical history of Bipolar disorder ([place]) and Cognitive developmental delay.  Past Surgical History:   has a past surgical history that includes Laparoscopic cholecystectomy (2011) and Esophagogastroduodenoscopy (N/A, 08/03/2017).  Social History:   reports that he has never smoked. He has never used smokeless tobacco. He reports that he does not drink alcohol and does not use drugs.  Skin: Clean, Dry, Intact  Patient is developmentally delayed. Patient/Family orientated to room. Information packet given to patient/family. Admission inpatient armband information verified with patient/family to include name and date of birth and placed on patient arm. Side rails up x 2, fall assessment and education completed with patient/family. Patient/family able to verbalize understanding of risk associated with falls and verbalized understanding to call for assistance before getting out of bed. Call light within reach. Patient/family able to voice and demonstrate understanding of unit orientation instructions.    Will continue to evaluate and treat per MD orders.

## 2019-12-08 NOTE — Consult Note (Signed)
Subjective:   HPI  The patient is a 44 year old male with a history of developmental delay who lives in a group home.  He is being seen today in consultation.  His mother is present.  He was brought to the hospital because of coffee-ground emesis.  This was noted at the group home and he also had an episode in the emergency room.  His mother states this is what happened to him in 2019.  At that time he had an EGD done by Dr. Oletta Lamas which revealed severe ulcerative esophagitis.  After that he was on Protonix twice a day but then it was decreased to once a day.    Past Medical History:  Diagnosis Date  . Bipolar disorder ([place])   . Cognitive developmental delay    Past Surgical History:  Procedure Laterality Date  . ESOPHAGOGASTRODUODENOSCOPY N/A 08/03/2017   Procedure: ESOPHAGOGASTRODUODENOSCOPY (EGD);  Surgeon: Laurence Spates, MD;  Location: [facility];  Service: Endoscopy;  Laterality: N/A;  . LAPAROSCOPIC CHOLECYSTECTOMY  2011   Social History   Socioeconomic History  . Marital status: Single    Spouse name: Not on file  . Number of children: Not on file  . Years of education: Not on file  . Highest education level: Not on file  Occupational History  . Not on file  Tobacco Use  . Smoking status: Never Smoker  . Smokeless tobacco: Never Used  Vaping Use  . Vaping Use: Never used  Substance and Sexual Activity  . Alcohol use: Never  . Drug use: Never  . Sexual activity: Not on file  Other Topics Concern  . Not on file  Social History Narrative  . Not on file   Social Determinants of Health   Financial Resource Strain:   . Difficulty of Paying Living Expenses:   Food Insecurity:   . Worried About Charity fundraiser in the Last Year:   . Arboriculturist in the Last Year:   Transportation Needs:   . Film/video editor (Medical):   Marland Kitchen Lack of Transportation (Non-Medical):   Physical Activity:   . Days of Exercise per Week:   . Minutes of Exercise per Session:    Stress:   . Feeling of Stress :   Social Connections:   . Frequency of Communication with Friends and Family:   . Frequency of Social Gatherings with Friends and Family:   . Attends Religious Services:   . Active Member of Clubs or Organizations:   . Attends Archivist Meetings:   Marland Kitchen Marital Status:   Intimate Partner Violence:   . Fear of Current or Ex-Partner:   . Emotionally Abused:   Marland Kitchen Physically Abused:   . Sexually Abused:    family history includes GER disease in his father and mother.  Current Facility-Administered Medications:  .  0.9 %  sodium chloride infusion, , Intravenous, Continuous, Chotiner, Yevonne Aline, MD, Last Rate: 100 mL/hr at 12/07/19 2345, New Bag at 12/07/19 2345 .  cloZAPine (CLOZARIL) tablet 400 mg, 400 mg, Oral, QPM, Chotiner, Yevonne Aline, MD, 400 mg at 12/07/19 2346 .  ondansetron (ZOFRAN) tablet 4 mg, 4 mg, Oral, Q6H PRN **OR** ondansetron (ZOFRAN) injection 4 mg, 4 mg, Intravenous, Q6H PRN, Chotiner, Yevonne Aline, MD .  pantoprazole (PROTONIX) 80 mg in sodium chloride 0.9 % 100 mL (0.8 mg/mL) infusion, 8 mg/hr, Intravenous, Continuous, Chotiner, Yevonne Aline, MD, Last Rate: 10 mL/hr at 12/08/19 0829, 8 mg/hr at 12/08/19 0829 Allergies  Allergen Reactions  .  Lamictal [Lamotrigine] Rash    Tongue thrush, burning, swelling, elbow eczema, sores on knee, finger, wrist, shinn and foot in 2004  . Mellaril [Thioridazine] Other (See Comments)    Neuroleptic malignant syndrome  . Zoloft [Sertraline Hcl] Other (See Comments)    Uncontrolled salvia, weak, limber - occurred very quickly after getting this medication     Objective:     BP 133/86   Pulse (!) 108   Temp 98.2 F (36.8 C)   Resp 18   Wt 96.1 kg   SpO2 96%   BMI 32.22 kg/m   No distress  Heart regular rhythm no murmurs  Lungs clear  Abdomen soft and nontender  Laboratory No components found for: D1    Assessment:     Coffee-ground emesis  History of ulcerative reflux  esophagitis      Plan:     Continue IV pantoprazole.  Watch for further signs of bleeding.  I think his symptoms and signs at this time are most consistent with ulcerative esophagitis again.  I discussed this with his mother.  I do not think he needs another endoscopy at this time.  She is agreeable. Lab Results  Component Value Date   HGB 15.1 12/08/2019   HGB 16.2 12/08/2019   HGB 16.6 12/07/2019   HCT 46.2 12/08/2019   HCT 49.0 12/08/2019   HCT 51.3 12/07/2019   ALKPHOS 89 12/07/2019   ALKPHOS 69 08/02/2017   ALKPHOS 79 05/08/2009   AST 27 12/07/2019   AST 22 08/02/2017   AST 31 05/08/2009   ALT 34 12/07/2019   ALT 20 08/02/2017   ALT 27 05/08/2009

## 2019-12-09 LAB — CBC WITH DIFFERENTIAL/PLATELET
Abs Immature Granulocytes: 0.03 10*3/uL (ref 0.00–0.07)
Basophils Absolute: 0.1 10*3/uL (ref 0.0–0.1)
Basophils Relative: 1 %
Eosinophils Absolute: 0.1 10*3/uL (ref 0.0–0.5)
Eosinophils Relative: 1 %
HCT: 43.7 % (ref 39.0–52.0)
Hemoglobin: 14.2 g/dL (ref 13.0–17.0)
Immature Granulocytes: 0 %
Lymphocytes Relative: 12 %
Lymphs Abs: 1.2 10*3/uL (ref 0.7–4.0)
MCH: 29.5 pg (ref 26.0–34.0)
MCHC: 32.5 g/dL (ref 30.0–36.0)
MCV: 90.9 fL (ref 80.0–100.0)
Monocytes Absolute: 1.3 10*3/uL — ABNORMAL HIGH (ref 0.1–1.0)
Monocytes Relative: 13 %
Neutro Abs: 7.5 10*3/uL (ref 1.7–7.7)
Neutrophils Relative %: 73 %
Platelets: 238 10*3/uL (ref 150–400)
RBC: 4.81 MIL/uL (ref 4.22–5.81)
RDW: 14.8 % (ref 11.5–15.5)
WBC: 10.2 10*3/uL (ref 4.0–10.5)
nRBC: 0 % (ref 0.0–0.2)

## 2019-12-09 LAB — C DIFFICILE QUICK SCREEN W PCR REFLEX
C Diff antigen: NEGATIVE
C Diff interpretation: NOT DETECTED
C Diff toxin: NEGATIVE

## 2019-12-09 MED ORDER — PANTOPRAZOLE SODIUM 40 MG PO TBEC: 40.0000 mg | DELAYED_RELEASE_TABLET | Freq: Two times a day (BID) | ORAL | 0 refills | Status: AC

## 2019-12-09 NOTE — Plan of Care (Signed)
  Problem: Elimination: Goal: Will not experience complications related to urinary retention Outcome: Completed/Met   Problem: Pain Managment: Goal: General experience of comfort will improve Outcome: Completed/Met   Problem: Safety: Goal: Ability to remain free from injury will improve Outcome: Completed/Met   Problem: Skin Integrity: Goal: Risk for impaired skin integrity will decrease Outcome: Completed/Met

## 2019-12-09 NOTE — Discharge Instructions (Signed)
Hematemesis Hematemesis is when you vomit blood. It is a sign of bleeding in the upper GI tract (gastrointestinal tract). The upper GI tract includes the mouth, throat, esophagus, stomach, and the upper part of the small intestine (duodenum). Hematemesis is usually caused by bleeding in the esophagus or stomach. You may suddenly vomit bright red blood. Or, the blood may look like coffee grounds. You may also have other symptoms, such as:  Stomach pain.  Heartburn.  Stool (feces) that looks black and tarry. Follow these instructions at home:   Take over-the-counter and prescription medicines only as told by your health care provider.  Do not take NSAIDs, including aspirin and ibuprofen, unless your health care provider approves. These medicines can increase bleeding.  Rest as needed.  Drink enough fluids to keep your urine pale yellow. Take small sips of fluid at a time.  Do not drink alcohol.  Do not use any products that contain nicotine or tobacco, such as cigarettes and e-cigarettes. If you need help quitting, ask your health care provider.  Keep all follow-up visits as told by your health care provider. This is important. Contact a health care provider if:  You have more blood in your vomit.  Your vomiting of blood begins again after it has stopped.  You have persistent stomach pain.  You have nausea, indigestion, or heartburn. Get help right away if:  You faint.  You feel weak or dizzy.  You are urinating less than normal or not at all.  You vomit up: ? Large amounts of blood or dark material that may look like coffee grounds. ? Bright red blood.  You have any of the following: ? Persistent vomiting. ? A rapid heartbeat. ? Blood in your stool. ? Chest pain. ? Difficulty breathing. These symptoms may represent a serious problem that is an emergency. Do not wait to see if the symptoms will go away. Get medical help right away. Call your local emergency services  (911 in the [place]). Do not drive yourself to the hospital. Summary  Hematemesis is when you vomit blood. It is a sign of bleeding in the upper GI tract (gastrointestinal tract).  Hematemesis is usually caused by bleeding in the esophagus or stomach.  Do not take NSAIDs (including aspirin and ibuprofen), drink alcohol, or use tobacco products.  Take over-the-counter and prescription medicines only as told by your health care provider. This information is not intended to replace advice given to you by your health care provider. Make sure you discuss any questions you have with your health care provider. Document Revised: 04/29/2017 Document Reviewed: 04/29/2017 Elsevier Patient Education  2020 [employer]  

## 2019-12-09 NOTE — Discharge Summary (Addendum)
Physician Discharge Summary  Spencer Cummings JKK:[id number] DOB: 04-09-1976 DOA: 12/07/2019  PCP: Lawerance Cruel, MD  Admit date: 12/07/2019 Discharge date: 12/09/2019  Admitted From: Group home Disposition: Group home  Recommendations for Outpatient Follow-up:  1. Follow up with PCP in 1-2 weeks 2. Please obtain BMP/CBC in one week 3. Please follow up with your PCP on the following pending results: Unresulted Labs (From admission, onward) Comment          Start     Ordered   12/09/19 0500  CBC with Differential/Platelet  Daily,   R      12/08/19 1325           Home Health: None Equipment/Devices: None  Discharge Condition: Stable CODE STATUS: Full code Diet recommendation: Regular  Subjective: Seen and examined.  He has no complaints.  Looks comfortable.  Father at the bedside.  Brief/Interim Summary: Spencer Cummings a 44 y.o.malewith medical history significant fordevelopmental delay and history of upper GI bleeding due to esophagitis presented from local group home with complaint of couple of episodes of hematemesis.  Upon arrival to ED, he was hemodynamically stable except some tachycardia.  Patient was given fluid bolus in the emergency room secondary to tachycardia and upper GI bleed.  He was started on Protonix drip.  Admitted under hospital service and GI consulted.  Presenting hemoglobin was 17.4.  Over the course of last 2 days, it dropped to only 14.2 but still within normal range.  Due to no further episodes of hematemesis and not a significant drop in hemoglobin and is still within normal range, Dr. Penelope Coop of GI decided to wait and watch.  He has finally cleared the patient for discharge.  Of note, patient was having some loose bowel movement since yesterday.  Although his diarrhea could be due to laxative effect of GI bleed but due to concern from his parents, C. difficile was tested and he was tested negative.  He is being discharged in stable condition.  GI has cleared  this patient.  They recommended omeprazole 40 mg p.o. twice daily for 30 days.  Discharge Diagnoses:  Principal Problem:   Upper GI bleeding Active Problems:   Hematemesis   Tachycardia   Developmental delay, profound    Discharge Instructions   Allergies as of 12/09/2019      Reactions   Lamictal [lamotrigine] Rash   Tongue thrush, burning, swelling, elbow eczema, sores on knee, finger, wrist, shinn and foot in 2004   Mellaril [thioridazine] Other (See Comments)   Neuroleptic malignant syndrome   Zoloft [sertraline Hcl] Other (See Comments)   Uncontrolled salvia, weak, limber - occurred very quickly after getting this medication      Medication List    STOP taking these medications   COLACE PO   ondansetron 4 MG tablet Commonly known as: ZOFRAN   polyethylene glycol 17 g packet Commonly known as: MIRALAX / GLYCOLAX   sucralfate 1 GM/10ML suspension Commonly known as: CARAFATE     TAKE these medications   cloZAPine 100 MG tablet Commonly known as: CLOZARIL Take 400 mg by mouth every evening.   pantoprazole 40 MG tablet Commonly known as: PROTONIX Take 1 tablet (40 mg total) by mouth 2 (two) times daily. What changed: when to take this       Follow-up [place], Charles Alan, MD Follow up in 1 week(s).   Specialty: Family Medicine Contact information: [place] [address] [phone]  Allergies  Allergen Reactions  . Lamictal [Lamotrigine] Rash    Tongue thrush, burning, swelling, elbow eczema, sores on knee, finger, wrist, shinn and foot in 2004  . Mellaril [Thioridazine] Other (See Comments)    Neuroleptic malignant syndrome  . Zoloft [Sertraline Hcl] Other (See Comments)    Uncontrolled salvia, weak, limber - occurred very quickly after getting this medication    Consultations: GI   Procedures/Studies: No results found.   Discharge Exam: Vitals:   12/09/19 0603 12/09/19 1035  BP: 104/69  101/65  Pulse: (!) 109 (!) 104  Resp: 18 16  Temp: 98.2 F (36.8 C) 98 F (36.7 C)  SpO2: 96% 100%   Vitals:   12/08/19 1633 12/08/19 2100 12/09/19 0603 12/09/19 1035  BP: 128/89 109/77 104/69 101/65  Pulse: 91 (!) 104 (!) 109 (!) 104  Resp: 20 16 18 16   Temp: 98.4 F (36.9 C) 99 F (37.2 C) 98.2 F (36.8 C) 98 F (36.7 C)  TempSrc: Oral Oral Oral Skin  SpO2: 99% 97% 96% 100%  Weight:   98.4 kg     General: Pt is alert, awake, not in acute distress Cardiovascular: RRR, S1/S2 +, no rubs, no gallops Respiratory: CTA bilaterally, no wheezing, no rhonchi Abdominal: Soft, NT, ND, bowel sounds + Extremities: no edema, no cyanosis    The results of significant diagnostics from this hospitalization (including imaging, microbiology, ancillary and laboratory) are listed below for reference.     Microbiology: Recent Results (from the past 240 hour(s))  SARS Coronavirus 2 by RT PCR (hospital order, performed in [facility] hospital lab) Nasopharyngeal Nasopharyngeal Swab     Status: None   Collection Time: 12/07/19  6:15 PM   Specimen: Nasopharyngeal Swab  Result Value Ref Range Status   SARS Coronavirus 2 NEGATIVE NEGATIVE Final    Comment: (NOTE) SARS-CoV-2 target nucleic acids are NOT DETECTED.  The SARS-CoV-2 RNA is generally detectable in upper and lower respiratory specimens during the acute phase of infection. The lowest concentration of SARS-CoV-2 viral copies this assay can detect is 250 copies / mL. A negative result does not preclude SARS-CoV-2 infection and should not be used as the sole basis for treatment or other patient management decisions.  A negative result may occur with improper specimen collection / handling, submission of specimen other than nasopharyngeal swab, presence of viral mutation(s) within the areas targeted by this assay, and inadequate number of viral copies (<250 copies / mL). A negative result must be combined with clinical observations,  patient history, and epidemiological information.  Fact Sheet for Patients:   StrictlyIdeas.no  Fact Sheet for Healthcare Providers: BankingDealers.co.za  This test is not yet approved or  cleared by the [place] FDA and has been authorized for detection and/or diagnosis of SARS-CoV-2 by FDA under an Emergency Use Authorization (EUA).  This EUA will remain in effect (meaning this test can be used) for the duration of the COVID-19 declaration under Section 564(b)(1) of the Act, 21 U.S.C. section 360bbb-3(b)(1), unless the authorization is terminated or revoked sooner.  Performed at [facility], [place] [address], [place], [place] [address]   C Difficile Quick Screen w PCR reflex     Status: None   Collection Time: 12/09/19 10:15 AM   Specimen: STOOL  Result Value Ref Range Status   C Diff antigen NEGATIVE NEGATIVE Final   C Diff toxin NEGATIVE NEGATIVE Final   C Diff interpretation No C. difficile detected.  Final    Comment: Performed  at [facility], [place] [address], [place], [place] [address]     Labs: BNP (last 3 results) No results for input(s): BNP in the last 8760 hours. Basic Metabolic Panel: Recent Labs  Lab 12/07/19 1822 12/08/19 0443  NA 138 139  K 3.8 4.0  CL 103 106  CO2 21* 18*  GLUCOSE 168* 146*  BUN 26* 25*  CREATININE 1.15 1.15  CALCIUM 10.3 9.6   Liver Function Tests: Recent Labs  Lab 12/07/19 1822  AST 27  ALT 34  ALKPHOS 89  BILITOT 0.8  PROT 8.3*  ALBUMIN 4.4   Recent Labs  Lab 12/07/19 1822  LIPASE 25   No results for input(s): AMMONIA in the last 168 hours. CBC: Recent Labs  Lab 12/07/19 1822 12/07/19 1822 12/07/19 2152 12/08/19 0443 12/08/19 1040 12/08/19 2321 12/09/19 0941  WBC 17.7*  --   --  18.1*  --   --  10.2  NEUTROABS 14.7*  --   --   --   --   --  7.5  HGB 17.4*   < > 16.6 16.2 15.1 13.9 14.2  HCT 54.6*   < > 51.3 49.0 46.2 43.4 43.7  MCV 90.5  --    --  89.4  --   --  90.9  PLT 307  --   --  257  --   --  238   < > = values in this interval not displayed.   Cardiac Enzymes: No results for input(s): CKTOTAL, CKMB, CKMBINDEX, TROPONINI in the last 168 hours. BNP: Invalid input(s): POCBNP CBG: No results for input(s): GLUCAP in the last 168 hours. D-Dimer No results for input(s): DDIMER in the last 72 hours. Hgb A1c No results for input(s): HGBA1C in the last 72 hours. Lipid Profile No results for input(s): CHOL, HDL, LDLCALC, TRIG, CHOLHDL, LDLDIRECT in the last 72 hours. Thyroid function studies No results for input(s): TSH, T4TOTAL, T3FREE, THYROIDAB in the last 72 hours.  Invalid input(s): FREET3 Anemia work up No results for input(s): VITAMINB12, FOLATE, FERRITIN, TIBC, IRON, RETICCTPCT in the last 72 hours. Urinalysis    Component Value Date/Time   COLORURINE AMBER (A) 08/02/2017 1741   APPEARANCEUR CLEAR 08/02/2017 1741   LABSPEC 1.028 08/02/2017 1741   PHURINE 5.0 08/02/2017 1741   GLUCOSEU NEGATIVE 08/02/2017 1741   HGBUR NEGATIVE 08/02/2017 1741   BILIRUBINUR NEGATIVE 08/02/2017 1741   KETONESUR 5 (A) 08/02/2017 1741   PROTEINUR 30 (A) 08/02/2017 1741   NITRITE NEGATIVE 08/02/2017 1741   LEUKOCYTESUR NEGATIVE 08/02/2017 1741   Sepsis Labs Invalid input(s): PROCALCITONIN,  WBC,  LACTICIDVEN Microbiology Recent Results (from the past 240 hour(s))  SARS Coronavirus 2 by RT PCR (hospital order, performed in [place] hospital lab) Nasopharyngeal Nasopharyngeal Swab     Status: None   Collection Time: 12/07/19  6:15 PM   Specimen: Nasopharyngeal Swab  Result Value Ref Range Status   SARS Coronavirus 2 NEGATIVE NEGATIVE Final    Comment: (NOTE) SARS-CoV-2 target nucleic acids are NOT DETECTED.  The SARS-CoV-2 RNA is generally detectable in upper and lower respiratory specimens during the acute phase of infection. The lowest concentration of SARS-CoV-2 viral copies this assay can detect is 250 copies / mL. A  negative result does not preclude SARS-CoV-2 infection and should not be used as the sole basis for treatment or other patient management decisions.  A negative result may occur with improper specimen collection / handling, submission of specimen other than nasopharyngeal swab, presence of  viral mutation(s) within the areas targeted by this assay, and inadequate number of viral copies (<250 copies / mL). A negative result must be combined with clinical observations, patient history, and epidemiological information.  Fact Sheet for Patients:   StrictlyIdeas.no  Fact Sheet for Healthcare Providers: BankingDealers.co.za  This test is not yet approved or  cleared by the [place] FDA and has been authorized for detection and/or diagnosis of SARS-CoV-2 by FDA under an Emergency Use Authorization (EUA).  This EUA will remain in effect (meaning this test can be used) for the duration of the COVID-19 declaration under Section 564(b)(1) of the Act, 21 U.S.C. section 360bbb-3(b)(1), unless the authorization is terminated or revoked sooner.  Performed at [facility], [place] [address], [place], [place] [address]   C Difficile Quick Screen w PCR reflex     Status: None   Collection Time: 12/09/19 10:15 AM   Specimen: STOOL  Result Value Ref Range Status   C Diff antigen NEGATIVE NEGATIVE Final   C Diff toxin NEGATIVE NEGATIVE Final   C Diff interpretation No C. difficile detected.  Final    Comment: Performed at [facility], [place] [address] [place], [place] [address]     Time coordinating discharge: Over 30 minutes  SIGNED:   Darliss Cheney, MD  [facility] 12/09/2019, 3:26 PM  If 7PM-7AM, please contact night-coverage [email or web address]

## 2019-12-09 NOTE — Progress Notes (Signed)
[facility] Progress Note  Subjective: No more vomiting today.  He does have diarrhea however.  No stool study has been collected yet.  His dad is worried about him being discharged back to the group home and potentially infecting other people in the group home if he has an infectious diarrhea.  Objective: Vital signs in last 24 hours: Temp:  [98.2 F (36.8 C)-99 F (37.2 C)] 98.2 F (36.8 C) (08/08 0603) Pulse Rate:  [91-109] 109 (08/08 0603) Resp:  [16-20] 18 (08/08 0603) BP: (104-128)/(69-89) 104/69 (08/08 0603) SpO2:  [96 %-99 %] 96 % (08/08 0603) Weight:  [98.4 kg] 98.4 kg (08/08 0603) Weight change: 2.268 kg   PE:  No distress  Lab Results: Results for orders placed or performed during the hospital encounter of 12/07/19 (from the past 24 hour(s))  Hemoglobin and hematocrit, blood     Status: None   Collection Time: 12/08/19 10:40 AM  Result Value Ref Range   Hemoglobin 15.1 13.0 - 17.0 g/dL   HCT 46.2 39 - 52 %  Hemoglobin and hematocrit, blood     Status: None   Collection Time: 12/08/19 11:21 PM  Result Value Ref Range   Hemoglobin 13.9 13.0 - 17.0 g/dL   HCT 43.4 39 - 52 %  CBC with Differential/Platelet     Status: Abnormal   Collection Time: 12/09/19  9:41 AM  Result Value Ref Range   WBC 10.2 4.0 - 10.5 K/uL   RBC 4.81 4.22 - 5.81 MIL/uL   Hemoglobin 14.2 13.0 - 17.0 g/dL   HCT 43.7 39 - 52 %   MCV 90.9 80.0 - 100.0 fL   MCH 29.5 26.0 - 34.0 pg   MCHC 32.5 30.0 - 36.0 g/dL   RDW 14.8 11.5 - 15.5 %   Platelets 238 150 - 400 K/uL   nRBC 0.0 0.0 - 0.2 %   Neutrophils Relative % 73 %   Neutro Abs 7.5 1.7 - 7.7 K/uL   Lymphocytes Relative 12 %   Lymphs Abs 1.2 0.7 - 4.0 K/uL   Monocytes Relative 13 %   Monocytes Absolute 1.3 (H) 0 - 1 K/uL   Eosinophils Relative 1 %   Eosinophils Absolute 0.1 0 - 0 K/uL   Basophils Relative 1 %   Basophils Absolute 0.1 0 - 0 K/uL   Immature Granulocytes 0 %   Abs Immature Granulocytes 0.03 0.00 - 0.07 K/uL     Studies/Results: No results found.    Assessment: Esophagitis, vomiting resolved  Diarrhea  Plan:   Continue PPI  Check stool for pathogens    Cassell Clement 12/09/2019, 10:17 AM  Pager: [phone] If no answer or after 5 PM call [phone]

## 2019-12-09 NOTE — Plan of Care (Signed)
°  Problem: Education: Goal: Knowledge of General Education information will improve Description: Including pain rating scale, medication(s)/side effects and non-pharmacologic comfort measures Outcome: Adequate for Discharge   Problem: Health Behavior/Discharge Planning: Goal: Ability to manage health-related needs will improve Outcome: Adequate for Discharge   Problem: Clinical Measurements: Goal: Ability to maintain clinical measurements within normal limits will improve Outcome: Adequate for Discharge Goal: Will remain free from infection Outcome: Adequate for Discharge Goal: Diagnostic test results will improve Outcome: Adequate for Discharge Goal: Respiratory complications will improve Outcome: Adequate for Discharge Goal: Cardiovascular complication will be avoided Outcome: Adequate for Discharge   Problem: Activity: Goal: Risk for activity intolerance will decrease Outcome: Adequate for Discharge   Problem: Nutrition: Goal: Adequate nutrition will be maintained Outcome: Adequate for Discharge   Problem: Coping: Goal: Level of anxiety will decrease Outcome: Adequate for Discharge   Problem: Elimination: Goal: Will not experience complications related to bowel motility Outcome: Adequate for Discharge   

## 2019-12-11 ENCOUNTER — Emergency Department (HOSPITAL_COMMUNITY)
Admission: EM | Admit: 2019-12-11 | Discharge: 2019-12-12 | Disposition: A | Discharge status: Home / Self Care | Payer: Medicare Other | Attending: Emergency Medicine | Admitting: Emergency Medicine

## 2019-12-11 ENCOUNTER — Encounter (HOSPITAL_COMMUNITY): Payer: Self-pay | Admitting: *Deleted

## 2019-12-11 ENCOUNTER — Other Ambulatory Visit: Payer: Self-pay

## 2019-12-11 DIAGNOSIS — K209 Esophagitis, unspecified without bleeding: Secondary | ICD-10-CM | POA: Diagnosis not present

## 2019-12-11 DIAGNOSIS — R1110 Vomiting, unspecified: Secondary | ICD-10-CM | POA: Diagnosis not present

## 2019-12-11 DIAGNOSIS — Z5321 Procedure and treatment not carried out due to patient leaving prior to being seen by health care provider: Secondary | ICD-10-CM | POA: Diagnosis not present

## 2019-12-11 DIAGNOSIS — R197 Diarrhea, unspecified: Secondary | ICD-10-CM | POA: Insufficient documentation

## 2019-12-11 DIAGNOSIS — Z888 Allergy status to other drugs, medicaments and biological substances status: Secondary | ICD-10-CM | POA: Diagnosis not present

## 2019-12-11 DIAGNOSIS — F319 Bipolar disorder, unspecified: Secondary | ICD-10-CM | POA: Diagnosis not present

## 2019-12-11 DIAGNOSIS — R112 Nausea with vomiting, unspecified: Secondary | ICD-10-CM | POA: Diagnosis not present

## 2019-12-11 DIAGNOSIS — E860 Dehydration: Secondary | ICD-10-CM | POA: Diagnosis not present

## 2019-12-11 DIAGNOSIS — Z79899 Other long term (current) drug therapy: Secondary | ICD-10-CM | POA: Diagnosis not present

## 2019-12-11 LAB — CBC
HCT: 46.2 % (ref 39.0–52.0)
Hemoglobin: 15 g/dL (ref 13.0–17.0)
MCH: 29.6 pg (ref 26.0–34.0)
MCHC: 32.5 g/dL (ref 30.0–36.0)
MCV: 91.1 fL (ref 80.0–100.0)
Platelets: 255 10*3/uL (ref 150–400)
RBC: 5.07 MIL/uL (ref 4.22–5.81)
RDW: 14.6 % (ref 11.5–15.5)
WBC: 9.4 10*3/uL (ref 4.0–10.5)
nRBC: 0 % (ref 0.0–0.2)

## 2019-12-11 LAB — BASIC METABOLIC PANEL
Anion gap: 13 (ref 5–15)
BUN: 14 mg/dL (ref 6–20)
CO2: 21 mmol/L — ABNORMAL LOW (ref 22–32)
Calcium: 9.4 mg/dL (ref 8.9–10.3)
Chloride: 105 mmol/L (ref 98–111)
Creatinine, Ser: 0.9 mg/dL (ref 0.61–1.24)
GFR calc Af Amer: >60 mL/min (ref >60)
GFR calc non Af Amer: >60 mL/min (ref >60)
Glucose, Bld: 128 mg/dL — ABNORMAL HIGH (ref 70–99)
Potassium: 3.1 mmol/L — ABNORMAL LOW (ref 3.5–5.1)
Sodium: 139 mmol/L (ref 135–145)

## 2019-12-11 MED ORDER — SODIUM CHLORIDE 0.9% FLUSH: 3.0000 mL | Freq: Once | INTRAVENOUS | Status: DC

## 2019-12-11 NOTE — ED Triage Notes (Signed)
Pt discharged on Sunday, vomited and then developed diarrhea.  Thought he had esophagitis.  Pt now vomiting and decreased appetite. Pt cannot tell us if in pain, which is normal for him

## 2019-12-17 DIAGNOSIS — F0632 Mood disorder due to known physiological condition with major depressive-like episode: Secondary | ICD-10-CM | POA: Diagnosis not present

## 2019-12-18 DIAGNOSIS — E876 Hypokalemia: Secondary | ICD-10-CM | POA: Diagnosis not present

## 2020-01-01 DIAGNOSIS — Z0000 Encounter for general adult medical examination without abnormal findings: Secondary | ICD-10-CM | POA: Diagnosis not present

## 2020-01-18 DIAGNOSIS — F0632 Mood disorder due to known physiological condition with major depressive-like episode: Secondary | ICD-10-CM | POA: Diagnosis not present

## 2020-02-13 ENCOUNTER — Ambulatory Visit
Admission: RE | Admit: 2020-02-13 | Discharge: 2020-02-13 | Disposition: A | Discharge status: Home / Self Care | Payer: Medicare Other | Source: Ambulatory Visit | Attending: Gastroenterology | Admitting: Gastroenterology

## 2020-02-13 ENCOUNTER — Other Ambulatory Visit: Payer: Self-pay | Admitting: Gastroenterology

## 2020-02-13 DIAGNOSIS — R112 Nausea with vomiting, unspecified: Secondary | ICD-10-CM

## 2020-02-13 DIAGNOSIS — K5900 Constipation, unspecified: Secondary | ICD-10-CM | POA: Diagnosis not present

## 2020-02-18 DIAGNOSIS — F0632 Mood disorder due to known physiological condition with major depressive-like episode: Secondary | ICD-10-CM | POA: Diagnosis not present

## 2020-02-25 DIAGNOSIS — R1110 Vomiting, unspecified: Secondary | ICD-10-CM | POA: Diagnosis not present

## 2020-02-25 DIAGNOSIS — K219 Gastro-esophageal reflux disease without esophagitis: Secondary | ICD-10-CM | POA: Diagnosis not present

## 2020-02-25 DIAGNOSIS — Z8719 Personal history of other diseases of the digestive system: Secondary | ICD-10-CM | POA: Diagnosis not present

## 2020-03-18 DIAGNOSIS — A09 Infectious gastroenteritis and colitis, unspecified: Secondary | ICD-10-CM | POA: Diagnosis not present

## 2020-03-25 DIAGNOSIS — F0632 Mood disorder due to known physiological condition with major depressive-like episode: Secondary | ICD-10-CM | POA: Diagnosis not present

## 2020-04-07 DIAGNOSIS — E876 Hypokalemia: Secondary | ICD-10-CM | POA: Diagnosis not present

## 2020-04-24 DIAGNOSIS — Z1159 Encounter for screening for other viral diseases: Secondary | ICD-10-CM | POA: Diagnosis not present

## 2020-04-24 DIAGNOSIS — F0632 Mood disorder due to known physiological condition with major depressive-like episode: Secondary | ICD-10-CM | POA: Diagnosis not present

## 2020-04-29 DIAGNOSIS — K2100 Gastro-esophageal reflux disease with esophagitis, without bleeding: Secondary | ICD-10-CM | POA: Diagnosis not present

## 2020-04-29 DIAGNOSIS — K3189 Other diseases of stomach and duodenum: Secondary | ICD-10-CM | POA: Diagnosis not present

## 2020-04-29 DIAGNOSIS — R112 Nausea with vomiting, unspecified: Secondary | ICD-10-CM | POA: Diagnosis not present

## 2020-04-29 DIAGNOSIS — K2930 Chronic superficial gastritis without bleeding: Secondary | ICD-10-CM | POA: Diagnosis not present

## 2020-04-29 DIAGNOSIS — Q399 Congenital malformation of esophagus, unspecified: Secondary | ICD-10-CM | POA: Diagnosis not present

## 2020-04-29 DIAGNOSIS — K2281 Esophageal polyp: Secondary | ICD-10-CM | POA: Diagnosis not present

## 2020-04-29 DIAGNOSIS — K2282 Esophagogastric junction polyp: Secondary | ICD-10-CM | POA: Diagnosis not present

## 2020-04-29 DIAGNOSIS — K219 Gastro-esophageal reflux disease without esophagitis: Secondary | ICD-10-CM | POA: Diagnosis not present

## 2020-05-01 DIAGNOSIS — K219 Gastro-esophageal reflux disease without esophagitis: Secondary | ICD-10-CM | POA: Diagnosis not present

## 2020-05-01 DIAGNOSIS — K2930 Chronic superficial gastritis without bleeding: Secondary | ICD-10-CM | POA: Diagnosis not present

## 2020-05-01 DIAGNOSIS — K2100 Gastro-esophageal reflux disease with esophagitis, without bleeding: Secondary | ICD-10-CM | POA: Diagnosis not present

## 2020-05-29 DIAGNOSIS — F0632 Mood disorder due to known physiological condition with major depressive-like episode: Secondary | ICD-10-CM | POA: Diagnosis not present

## 2020-06-25 DIAGNOSIS — K219 Gastro-esophageal reflux disease without esophagitis: Secondary | ICD-10-CM | POA: Diagnosis not present

## 2020-06-25 DIAGNOSIS — Z8719 Personal history of other diseases of the digestive system: Secondary | ICD-10-CM | POA: Diagnosis not present

## 2020-07-03 DIAGNOSIS — F0632 Mood disorder due to known physiological condition with major depressive-like episode: Secondary | ICD-10-CM | POA: Diagnosis not present

## 2020-07-10 DIAGNOSIS — F0632 Mood disorder due to known physiological condition with major depressive-like episode: Secondary | ICD-10-CM | POA: Diagnosis not present

## 2020-07-10 DIAGNOSIS — F71 Moderate intellectual disabilities: Secondary | ICD-10-CM | POA: Diagnosis not present

## 2020-07-18 ENCOUNTER — Other Ambulatory Visit: Payer: Self-pay | Admitting: Physician Assistant

## 2020-07-18 ENCOUNTER — Other Ambulatory Visit (HOSPITAL_COMMUNITY): Payer: Self-pay | Admitting: Physician Assistant

## 2020-07-18 DIAGNOSIS — R112 Nausea with vomiting, unspecified: Secondary | ICD-10-CM

## 2020-07-19 ENCOUNTER — Encounter (HOSPITAL_COMMUNITY): Payer: Self-pay

## 2020-07-19 ENCOUNTER — Emergency Department (HOSPITAL_COMMUNITY)
Admission: EM | Admit: 2020-07-19 | Discharge: 2020-07-19 | Disposition: A | Discharge status: Home / Self Care | Payer: Medicare Other | Attending: Emergency Medicine | Admitting: Emergency Medicine

## 2020-07-19 ENCOUNTER — Other Ambulatory Visit: Payer: Self-pay

## 2020-07-19 DIAGNOSIS — R000 Tachycardia, unspecified: Secondary | ICD-10-CM | POA: Diagnosis not present

## 2020-07-19 DIAGNOSIS — R112 Nausea with vomiting, unspecified: Secondary | ICD-10-CM

## 2020-07-19 HISTORY — DX: Gastro-esophageal reflux disease without esophagitis: K21.9

## 2020-07-19 LAB — COMPREHENSIVE METABOLIC PANEL
ALT: 25 U/L (ref 0–44)
AST: 22 U/L (ref 15–41)
Albumin: 4 g/dL (ref 3.5–5.0)
Alkaline Phosphatase: 71 U/L (ref 38–126)
Anion gap: 11 (ref 5–15)
BUN: 29 mg/dL — ABNORMAL HIGH (ref 6–20)
CO2: 22 mmol/L (ref 22–32)
Calcium: 9.4 mg/dL (ref 8.9–10.3)
Chloride: 108 mmol/L (ref 98–111)
Creatinine, Ser: 1.06 mg/dL (ref 0.61–1.24)
GFR, Estimated: >60 mL/min (ref >60)
Glucose, Bld: 131 mg/dL — ABNORMAL HIGH (ref 70–99)
Potassium: 3.8 mmol/L (ref 3.5–5.1)
Sodium: 141 mmol/L (ref 135–145)
Total Bilirubin: 1 mg/dL (ref 0.3–1.2)
Total Protein: 7.3 g/dL (ref 6.5–8.1)

## 2020-07-19 LAB — CBC
HCT: 50.4 % (ref 39.0–52.0)
Hemoglobin: 16.5 g/dL (ref 13.0–17.0)
MCH: 29.6 pg (ref 26.0–34.0)
MCHC: 32.7 g/dL (ref 30.0–36.0)
MCV: 90.3 fL (ref 80.0–100.0)
Platelets: 262 10*3/uL (ref 150–400)
RBC: 5.58 MIL/uL (ref 4.22–5.81)
RDW: 15.8 % — ABNORMAL HIGH (ref 11.5–15.5)
WBC: 13.1 10*3/uL — ABNORMAL HIGH (ref 4.0–10.5)
nRBC: 0 % (ref 0.0–0.2)

## 2020-07-19 LAB — LIPASE, BLOOD: Lipase: 23 U/L (ref 11–51)

## 2020-07-19 MED ORDER — ONDANSETRON 4 MG PO TBDP
4.0000 mg | ORAL_TABLET | Freq: Three times a day (TID) | ORAL | 0 refills | Status: AC | PRN
Start: 2020-07-19 — End: ?

## 2020-07-19 MED ORDER — PANTOPRAZOLE SODIUM 40 MG IV SOLR
40.0000 mg | Freq: Once | INTRAVENOUS | Status: AC
  Administered 2020-07-19: 40 mg via INTRAVENOUS
  Filled 2020-07-19: qty 40

## 2020-07-19 MED ORDER — ONDANSETRON HCL 4 MG/2ML IJ SOLN
4.0000 mg | Freq: Once | INTRAMUSCULAR | Status: AC
  Administered 2020-07-19: 4 mg via INTRAVENOUS
  Filled 2020-07-19: qty 2

## 2020-07-19 MED ORDER — SODIUM CHLORIDE 0.9 % IV BOLUS
1000.0000 mL | Freq: Once | INTRAVENOUS | Status: AC
  Administered 2020-07-19: 1000 mL via INTRAVENOUS

## 2020-07-19 NOTE — ED Notes (Signed)
Pt has not vomited since apple juice was given an hour ago

## 2020-07-19 NOTE — ED Triage Notes (Signed)
Patient's mother/guardian reports that the patient began having vomiting and diarrhea since yesterday. Mother reports that this has been going on since August 2020. Patient lives in a group home. Mother reports that the patient was given zofran at the group home.

## 2020-07-19 NOTE — ED Notes (Signed)
Pt mother verbalized understanding of d/c, medication, and follow up care. Ambulatory with steady gait.

## 2020-07-19 NOTE — Discharge Instructions (Signed)
Please read and follow all provided instructions.  Your diagnoses today include:  1. Non-intractable vomiting with nausea, unspecified vomiting type     TTests performed today include:  Blood cell counts and platelets - high white blood cell count  Kidney and liver function tests  Pancreas function test (called lipase)  Urine test to look for infection  Vital signs. See below for your results today.   Medications prescribed:   Zofran (ondansetron) - for nausea and vomiting  Take any prescribed medications only as directed.  Home care instructions:   Follow any educational materials contained in this packet.   Take the zofran every 8 hours on a schedule for the next 24 hours  Drink clear liquids for the next 24 hours and introduce solid foods slowly after 24 hours using the b.r.a.t. diet (Bananas, Rice, Applesauce, Toast, Yogurt).    Follow-up instructions: Please follow-up with your primary care provider in the next 3 days for further evaluation of your symptoms. \  Return instructions:  SEEK IMMEDIATE MEDICAL ATTENTION IF:  If you have pain that does not go away or becomes severe   A temperature above 101F develops   Repeated vomiting occurs (multiple episodes)   If you have pain that becomes localized to portions of the abdomen. The right side could possibly be appendicitis. In an adult, the left lower portion of the abdomen could be colitis or diverticulitis.   Blood is being passed in stools or vomit (bright red or black tarry stools)   You develop chest pain, difficulty breathing, dizziness or fainting, or become confused, poorly responsive, or inconsolable (young children)  If you have any other emergent concerns regarding your health  Additional Information: Abdominal (belly) pain can be caused by many things. Your caregiver performed an examination and possibly ordered blood/urine tests and imaging (CT scan, x-rays, ultrasound). Many cases can be observed  and treated at home after initial evaluation in the emergency department. Even though you are being discharged home, abdominal pain can be unpredictable. Therefore, you need a repeated exam if your pain does not resolve, returns, or worsens. Most patients with abdominal pain don't have to be admitted to the hospital or have surgery, but serious problems like appendicitis and gallbladder attacks can start out as nonspecific pain. Many abdominal conditions cannot be diagnosed in one visit, so follow-up evaluations are very important.  Your vital signs today were: BP (!) 123/97   Pulse (!) 112   Temp 98.3 F (36.8 C) (Oral)   Resp 16   Ht 5\' 8"  (1.727 m)   Wt 90.7 kg   SpO2 97%   BMI 30.41 kg/m  If your blood pressure (bp) was elevated above 135/85 this visit, please have this repeated by your doctor within one month. --------------

## 2020-07-19 NOTE — ED Provider Notes (Signed)
[facility] DEPT Provider Note   CSN: [id number] Arrival date & time: 07/19/20  1418     History Chief Complaint  Patient presents with  . Emesis  . Diarrhea    Spencer Cummings is a 45 y.o. male.  Patient with medical history significant for developmental delay, bipolar disorder, chronic clozaril use, history of upper GI bleeding and severe esophagitis, presents from local group home with family who is his legal guardian --for evaluation of nausea and vomiting.  Patient has had episodes of persistent vomiting and diarrhea for which she has been hospitalized intermittently persistent shortness of breath starting August 2021.  Patient has been having persistent vomiting, not able to hold down any fluids, over the past 1 to 2 days.  They have given Zofran without improvement.  He is on Protonix chronically.  Patient has not acted like he is in any pain, would not typically verbalize if he was in any pain.  No reported fevers.  Level 5 caveat due to nonverbal.  Family reports upper endoscopy in 04/2020 which was unremarkable.  Plan for gastric emptying study next month.        Past Medical History:  Diagnosis Date  . Bipolar disorder ([place])   . Cognitive developmental delay   . GERD (gastroesophageal reflux disease)     Patient Active Problem List   Diagnosis Date Noted  . Upper GI bleeding 12/07/2019  . Tachycardia 12/07/2019  . Developmental delay, profound 12/07/2019  . Sebaceous cyst 09/18/2019  . Hypokalemia 08/03/2017  . Leucocytosis 08/03/2017  . Hematemesis 08/02/2017    Past Surgical History:  Procedure Laterality Date  . ESOPHAGOGASTRODUODENOSCOPY N/A 08/03/2017   Procedure: ESOPHAGOGASTRODUODENOSCOPY (EGD);  Surgeon: Laurence Spates, MD;  Location: [facility];  Service: Endoscopy;  Laterality: N/A;  . LAPAROSCOPIC CHOLECYSTECTOMY  2011       Family History  Problem Relation Age of Onset  . GER disease Mother   . GER disease  Father   . GI Bleed Neg Hx        Some question if patients great grandfather may have had GI Bleed, but not sure.    Social History   Tobacco Use  . Smoking status: Never Smoker  . Smokeless tobacco: Never Used  Vaping Use  . Vaping Use: Never used  Substance Use Topics  . Alcohol use: Never  . Drug use: Never    Home Medications Prior to Admission medications   Medication Sig Start Date End Date Taking? Authorizing Provider  cloZAPine (CLOZARIL) 100 MG tablet Take 400 mg by mouth every evening. 07/12/17   [provider]  pantoprazole (PROTONIX) 40 MG tablet Take 1 tablet (40 mg total) by mouth 2 (two) times daily. 12/09/19 01/08/20  Darliss Cheney, MD    Allergies    Lamictal [lamotrigine], Mellaril [thioridazine], and Zoloft [sertraline hcl]  Review of Systems   Review of Systems  Unable to perform ROS: Patient nonverbal    Physical Exam Updated Vital Signs BP (!) 137/96 (BP Location: Right Arm)   Pulse (!) 113   Temp 98.3 F (36.8 C) (Oral)   Resp 18   Ht 5\' 8"  (1.727 m)   Wt 90.7 kg   SpO2 98%   BMI 30.41 kg/m   Physical Exam Vitals and nursing note reviewed.  Constitutional:      Appearance: He is well-developed.  HENT:     Head: Normocephalic and atraumatic.     Nose: Nose normal.     Mouth/Throat:  Mouth: Mucous membranes are dry.     Comments: Mildly dry oral mucosa Eyes:     General:        Right eye: No discharge.        Left eye: No discharge.     Conjunctiva/sclera: Conjunctivae normal.  Cardiovascular:     Rate and Rhythm: Regular rhythm. Tachycardia present.     Heart sounds: Normal heart sounds.     Comments: Mild tachycardia Pulmonary:     Effort: Pulmonary effort is normal.     Breath sounds: Normal breath sounds.  Abdominal:     Palpations: Abdomen is soft.     Tenderness: There is no abdominal tenderness. There is no guarding or rebound.     Comments: Patient does not wince with either light or deep palpation across an  portion of the abdomen.  Musculoskeletal:     Cervical back: Normal range of motion and neck supple.  Skin:    General: Skin is warm and dry.  Neurological:     Mental Status: He is alert. Mental status is at baseline.     ED Results / Procedures / Treatments   Labs (all labs ordered are listed, but only abnormal results are displayed) Labs Reviewed  COMPREHENSIVE METABOLIC PANEL - Abnormal; Notable for the following components:      Result Value   Glucose, Bld 131 (*)    BUN 29 (*)    All other components within normal limits  CBC - Abnormal; Notable for the following components:   WBC 13.1 (*)    RDW 15.8 (*)    All other components within normal limits  LIPASE, BLOOD  URINALYSIS, ROUTINE W REFLEX MICROSCOPIC    EKG None  Radiology No results found.  Procedures Procedures   Medications Ordered in ED Medications  sodium chloride 0.9 % bolus 1,000 mL (has no administration in time range)  pantoprazole (PROTONIX) injection 40 mg (has no administration in time range)  ondansetron (ZOFRAN) injection 4 mg (has no administration in time range)    ED Course  I have reviewed the triage vital signs and the nursing notes.  Pertinent labs & imaging results that were available during my care of the patient were reviewed by me and considered in my medical decision making (see chart for details).  Patient seen and examined.  Work-up reviewed.  Very well-documented history reviewed with family member at bedside.  Current plan is to give IV fluids, antiemetics, Protonix.  Will p.o. challenge.  May need imaging if he continues to have vomiting.   Currently no fever.  Patient is tachycardic in the low 100s.  He appears slightly dry.  Vital signs reviewed and are as follows: BP (!) 137/96 (BP Location: Right Arm)   Pulse (!) 113   Temp 98.3 F (36.8 C) (Oral)   Resp 18   Ht 5\' 8"  (1.727 m)   Wt 90.7 kg   SpO2 98%   BMI 30.41 kg/m   5:42 PM Pt rechecked. Just drank some  apple juice. He was shivering, given another blanket. Will monitor for vomiting. Family updated on labs, which are otherwise reassuring.   7:41 PM patient rechecked.  He is doing well without vomiting.  Plan for discharge back to group home.  I discussed all findings with patient's mother at bedside.  She is comfortable discharged home at this time.  Strongly encouraged continued GI follow-up as planned.  I recommended that they take Zofran on the schedule for the next 24  hours, every 8 hours, to help prevent return of vomiting.  Encouraged clear liquid diet through tomorrow morning and then bland foods after that.  The patient was urged to return to the Emergency Department immediately with worsening of current symptoms, worsening abdominal pain, persistent vomiting, blood noted in stools, fever, or any other concerns. The patient verbalized understanding.      MDM Rules/Calculators/A&P                          Patient with N/V, episodic over several months.  Currently undergoing evaluation by GI. Vitals are stable, no fever. Labs elevated to [facility], otherwise normal. Imaging not felt indicated as patient has controlled nausea and no signs of abdominal pain. Patient is tolerating PO's. Lungs are clear and no signs suggestive of PNA. Low concern for appendicitis, cholecystitis, pancreatitis, ruptured viscus, UTI, kidney stone, aortic dissection, aortic aneurysm or other emergent abdominal etiology. Supportive therapy indicated with return if symptoms worsen.     Final Clinical Impression(s) / ED Diagnoses Final diagnoses:  Non-intractable vomiting with nausea, unspecified vomiting type    Rx / DC Orders ED Discharge Orders         Ordered    ondansetron (ZOFRAN ODT) 4 MG disintegrating tablet  Every 8 hours PRN        07/19/20 1939           Carlisle Cater, PA-C 07/19/20 [place], [place], DO 07/19/20 1958

## 2020-07-21 ENCOUNTER — Other Ambulatory Visit: Payer: Self-pay | Admitting: Gastroenterology

## 2020-07-21 DIAGNOSIS — R112 Nausea with vomiting, unspecified: Secondary | ICD-10-CM

## 2020-07-31 ENCOUNTER — Other Ambulatory Visit: Payer: Self-pay | Admitting: Gastroenterology

## 2020-07-31 DIAGNOSIS — R112 Nausea with vomiting, unspecified: Secondary | ICD-10-CM

## 2020-08-01 ENCOUNTER — Other Ambulatory Visit: Payer: Self-pay

## 2020-08-01 ENCOUNTER — Ambulatory Visit (HOSPITAL_BASED_OUTPATIENT_CLINIC_OR_DEPARTMENT_OTHER)
Admission: RE | Admit: 2020-08-01 | Discharge: 2020-08-01 | Disposition: A | Discharge status: Home / Self Care | Payer: Medicare Other | Source: Ambulatory Visit | Attending: Gastroenterology | Admitting: Gastroenterology

## 2020-08-01 DIAGNOSIS — R112 Nausea with vomiting, unspecified: Secondary | ICD-10-CM | POA: Diagnosis not present

## 2020-08-01 DIAGNOSIS — R739 Hyperglycemia, unspecified: Secondary | ICD-10-CM | POA: Diagnosis not present

## 2020-08-01 DIAGNOSIS — R197 Diarrhea, unspecified: Secondary | ICD-10-CM | POA: Diagnosis not present

## 2020-08-01 DIAGNOSIS — D72829 Elevated white blood cell count, unspecified: Secondary | ICD-10-CM | POA: Diagnosis not present

## 2020-08-01 DIAGNOSIS — R1110 Vomiting, unspecified: Secondary | ICD-10-CM | POA: Diagnosis not present

## 2020-08-01 DIAGNOSIS — Z79899 Other long term (current) drug therapy: Secondary | ICD-10-CM | POA: Diagnosis not present

## 2020-08-04 ENCOUNTER — Other Ambulatory Visit: Payer: Medicare Other

## 2020-08-08 DIAGNOSIS — F0632 Mood disorder due to known physiological condition with major depressive-like episode: Secondary | ICD-10-CM | POA: Diagnosis not present

## 2020-08-12 ENCOUNTER — Other Ambulatory Visit: Payer: Self-pay

## 2020-08-12 ENCOUNTER — Encounter (HOSPITAL_COMMUNITY)
Admission: RE | Admit: 2020-08-12 | Discharge: 2020-08-12 | Disposition: A | Discharge status: Home / Self Care | Payer: Medicare Other | Source: Ambulatory Visit | Attending: Physician Assistant | Admitting: Physician Assistant

## 2020-08-12 DIAGNOSIS — R112 Nausea with vomiting, unspecified: Secondary | ICD-10-CM | POA: Insufficient documentation

## 2020-08-12 DIAGNOSIS — K3184 Gastroparesis: Secondary | ICD-10-CM | POA: Diagnosis not present

## 2020-08-12 DIAGNOSIS — K30 Functional dyspepsia: Secondary | ICD-10-CM | POA: Diagnosis not present

## 2020-08-12 MED ORDER — TECHNETIUM TC 99M SULFUR COLLOID
2.2000 | Freq: Once | INTRAVENOUS | Status: AC | PRN
  Administered 2020-08-12: 2.2 via INTRAVENOUS

## 2020-08-13 ENCOUNTER — Other Ambulatory Visit (HOSPITAL_COMMUNITY): Payer: Self-pay | Admitting: Physician Assistant

## 2020-08-13 DIAGNOSIS — R1110 Vomiting, unspecified: Secondary | ICD-10-CM | POA: Diagnosis not present

## 2020-08-13 DIAGNOSIS — R197 Diarrhea, unspecified: Secondary | ICD-10-CM

## 2020-08-13 DIAGNOSIS — K219 Gastro-esophageal reflux disease without esophagitis: Secondary | ICD-10-CM | POA: Diagnosis not present

## 2020-08-13 DIAGNOSIS — R634 Abnormal weight loss: Secondary | ICD-10-CM

## 2020-08-13 DIAGNOSIS — R1010 Upper abdominal pain, unspecified: Secondary | ICD-10-CM

## 2020-08-21 ENCOUNTER — Other Ambulatory Visit: Payer: Self-pay

## 2020-08-21 ENCOUNTER — Encounter (HOSPITAL_COMMUNITY): Payer: Self-pay

## 2020-08-21 ENCOUNTER — Ambulatory Visit (HOSPITAL_COMMUNITY)
Admission: RE | Admit: 2020-08-21 | Discharge: 2020-08-21 | Disposition: A | Discharge status: Home / Self Care | Payer: Medicare Other | Source: Ambulatory Visit | Attending: Physician Assistant | Admitting: Physician Assistant

## 2020-08-21 DIAGNOSIS — R1010 Upper abdominal pain, unspecified: Secondary | ICD-10-CM | POA: Diagnosis not present

## 2020-08-21 DIAGNOSIS — R1110 Vomiting, unspecified: Secondary | ICD-10-CM | POA: Diagnosis not present

## 2020-08-21 DIAGNOSIS — R634 Abnormal weight loss: Secondary | ICD-10-CM | POA: Diagnosis not present

## 2020-08-21 DIAGNOSIS — R197 Diarrhea, unspecified: Secondary | ICD-10-CM | POA: Insufficient documentation

## 2020-08-21 MED ORDER — IOHEXOL 300 MG/ML  SOLN
100.0000 mL | Freq: Once | INTRAMUSCULAR | Status: AC | PRN
  Administered 2020-08-21: 100 mL via INTRAVENOUS

## 2020-08-25 ENCOUNTER — Other Ambulatory Visit: Payer: Self-pay | Admitting: Physician Assistant

## 2020-08-25 DIAGNOSIS — R1010 Upper abdominal pain, unspecified: Secondary | ICD-10-CM

## 2020-09-09 DIAGNOSIS — F0632 Mood disorder due to known physiological condition with major depressive-like episode: Secondary | ICD-10-CM | POA: Diagnosis not present

## 2020-09-10 ENCOUNTER — Ambulatory Visit
Admission: RE | Admit: 2020-09-10 | Discharge: 2020-09-10 | Disposition: A | Discharge status: Home / Self Care | Payer: Medicare Other | Source: Ambulatory Visit | Attending: Physician Assistant | Admitting: Physician Assistant

## 2020-09-10 ENCOUNTER — Other Ambulatory Visit: Payer: Self-pay

## 2020-09-10 DIAGNOSIS — R1010 Upper abdominal pain, unspecified: Secondary | ICD-10-CM

## 2020-09-10 MED ORDER — GADOBENATE DIMEGLUMINE 529 MG/ML IV SOLN
18.0000 mL | Freq: Once | INTRAVENOUS | Status: AC | PRN
  Administered 2020-09-10: 18 mL via INTRAVENOUS

## 2020-10-13 DIAGNOSIS — F0632 Mood disorder due to known physiological condition with major depressive-like episode: Secondary | ICD-10-CM | POA: Diagnosis not present

## 2020-11-11 DIAGNOSIS — F0632 Mood disorder due to known physiological condition with major depressive-like episode: Secondary | ICD-10-CM | POA: Diagnosis not present

## 2020-11-26 DIAGNOSIS — L0231 Cutaneous abscess of buttock: Secondary | ICD-10-CM | POA: Diagnosis not present

## 2020-11-26 DIAGNOSIS — L089 Local infection of the skin and subcutaneous tissue, unspecified: Secondary | ICD-10-CM | POA: Diagnosis not present

## 2020-12-16 DIAGNOSIS — F0632 Mood disorder due to known physiological condition with major depressive-like episode: Secondary | ICD-10-CM | POA: Diagnosis not present

## 2020-12-30 DIAGNOSIS — E782 Mixed hyperlipidemia: Secondary | ICD-10-CM | POA: Diagnosis not present

## 2020-12-30 DIAGNOSIS — D72829 Elevated white blood cell count, unspecified: Secondary | ICD-10-CM | POA: Diagnosis not present

## 2020-12-30 DIAGNOSIS — Z79899 Other long term (current) drug therapy: Secondary | ICD-10-CM | POA: Diagnosis not present

## 2021-01-07 DIAGNOSIS — K219 Gastro-esophageal reflux disease without esophagitis: Secondary | ICD-10-CM | POA: Diagnosis not present

## 2021-01-07 DIAGNOSIS — F79 Unspecified intellectual disabilities: Secondary | ICD-10-CM | POA: Diagnosis not present

## 2021-01-07 DIAGNOSIS — Z79899 Other long term (current) drug therapy: Secondary | ICD-10-CM | POA: Diagnosis not present

## 2021-01-07 DIAGNOSIS — Z0000 Encounter for general adult medical examination without abnormal findings: Secondary | ICD-10-CM | POA: Diagnosis not present

## 2021-01-07 DIAGNOSIS — Z1389 Encounter for screening for other disorder: Secondary | ICD-10-CM | POA: Diagnosis not present

## 2021-01-07 DIAGNOSIS — E782 Mixed hyperlipidemia: Secondary | ICD-10-CM | POA: Diagnosis not present

## 2021-01-08 DIAGNOSIS — Z79899 Other long term (current) drug therapy: Secondary | ICD-10-CM | POA: Diagnosis not present

## 2021-01-08 DIAGNOSIS — F71 Moderate intellectual disabilities: Secondary | ICD-10-CM | POA: Diagnosis not present

## 2021-01-08 DIAGNOSIS — F3130 Bipolar disorder, current episode depressed, mild or moderate severity, unspecified: Secondary | ICD-10-CM | POA: Diagnosis not present

## 2021-01-19 DIAGNOSIS — F0632 Mood disorder due to known physiological condition with major depressive-like episode: Secondary | ICD-10-CM | POA: Diagnosis not present

## 2021-02-20 DIAGNOSIS — F0632 Mood disorder due to known physiological condition with major depressive-like episode: Secondary | ICD-10-CM | POA: Diagnosis not present

## 2021-03-24 DIAGNOSIS — F0632 Mood disorder due to known physiological condition with major depressive-like episode: Secondary | ICD-10-CM | POA: Diagnosis not present

## 2021-04-20 DIAGNOSIS — D7389 Other diseases of spleen: Secondary | ICD-10-CM | POA: Diagnosis not present

## 2021-04-20 DIAGNOSIS — Z8719 Personal history of other diseases of the digestive system: Secondary | ICD-10-CM | POA: Diagnosis not present

## 2021-04-20 DIAGNOSIS — Z87898 Personal history of other specified conditions: Secondary | ICD-10-CM | POA: Diagnosis not present

## 2021-04-20 DIAGNOSIS — Z1211 Encounter for screening for malignant neoplasm of colon: Secondary | ICD-10-CM | POA: Diagnosis not present

## 2021-05-07 DIAGNOSIS — Z79899 Other long term (current) drug therapy: Secondary | ICD-10-CM | POA: Diagnosis not present

## 2021-05-11 ENCOUNTER — Other Ambulatory Visit: Payer: Self-pay | Admitting: Gastroenterology

## 2021-05-11 ENCOUNTER — Ambulatory Visit
Admission: RE | Admit: 2021-05-11 | Discharge: 2021-05-11 | Disposition: A | Discharge status: Home / Self Care | Payer: Commercial Managed Care - HMO | Source: Ambulatory Visit | Attending: Gastroenterology | Admitting: Gastroenterology

## 2021-05-11 DIAGNOSIS — R197 Diarrhea, unspecified: Secondary | ICD-10-CM

## 2021-05-11 DIAGNOSIS — R1110 Vomiting, unspecified: Secondary | ICD-10-CM

## 2021-05-11 DIAGNOSIS — R109 Unspecified abdominal pain: Secondary | ICD-10-CM | POA: Diagnosis not present

## 2021-07-22 DIAGNOSIS — Z79899 Other long term (current) drug therapy: Secondary | ICD-10-CM | POA: Diagnosis not present

## 2021-10-13 DIAGNOSIS — Z79899 Other long term (current) drug therapy: Secondary | ICD-10-CM | POA: Diagnosis not present

## 2021-11-23 DIAGNOSIS — Z79899 Other long term (current) drug therapy: Secondary | ICD-10-CM | POA: Diagnosis not present

## 2021-12-22 DIAGNOSIS — Z79899 Other long term (current) drug therapy: Secondary | ICD-10-CM | POA: Diagnosis not present

## 2021-12-29 DIAGNOSIS — Z79899 Other long term (current) drug therapy: Secondary | ICD-10-CM | POA: Diagnosis not present

## 2022-01-05 DIAGNOSIS — Z79899 Other long term (current) drug therapy: Secondary | ICD-10-CM | POA: Diagnosis not present

## 2022-01-08 DIAGNOSIS — Z79899 Other long term (current) drug therapy: Secondary | ICD-10-CM | POA: Diagnosis not present

## 2022-01-08 DIAGNOSIS — E782 Mixed hyperlipidemia: Secondary | ICD-10-CM | POA: Diagnosis not present

## 2022-01-12 DIAGNOSIS — Z79899 Other long term (current) drug therapy: Secondary | ICD-10-CM | POA: Diagnosis not present

## 2022-01-14 DIAGNOSIS — Z0000 Encounter for general adult medical examination without abnormal findings: Secondary | ICD-10-CM | POA: Diagnosis not present

## 2022-01-14 DIAGNOSIS — H5010 Unspecified exotropia: Secondary | ICD-10-CM | POA: Diagnosis not present

## 2022-01-14 DIAGNOSIS — Z79899 Other long term (current) drug therapy: Secondary | ICD-10-CM | POA: Diagnosis not present

## 2022-01-18 DIAGNOSIS — Z79899 Other long term (current) drug therapy: Secondary | ICD-10-CM | POA: Diagnosis not present

## 2022-02-02 DIAGNOSIS — Z79899 Other long term (current) drug therapy: Secondary | ICD-10-CM | POA: Diagnosis not present

## 2022-02-09 DIAGNOSIS — Z79899 Other long term (current) drug therapy: Secondary | ICD-10-CM | POA: Diagnosis not present

## 2022-02-16 DIAGNOSIS — Z79899 Other long term (current) drug therapy: Secondary | ICD-10-CM | POA: Diagnosis not present

## 2022-02-23 DIAGNOSIS — Z79899 Other long term (current) drug therapy: Secondary | ICD-10-CM | POA: Diagnosis not present

## 2022-03-02 DIAGNOSIS — Z79899 Other long term (current) drug therapy: Secondary | ICD-10-CM | POA: Diagnosis not present

## 2022-03-09 DIAGNOSIS — Z79899 Other long term (current) drug therapy: Secondary | ICD-10-CM | POA: Diagnosis not present

## 2022-03-16 DIAGNOSIS — Z79899 Other long term (current) drug therapy: Secondary | ICD-10-CM | POA: Diagnosis not present

## 2022-03-23 DIAGNOSIS — L0291 Cutaneous abscess, unspecified: Secondary | ICD-10-CM | POA: Diagnosis not present

## 2022-03-23 DIAGNOSIS — Z79899 Other long term (current) drug therapy: Secondary | ICD-10-CM | POA: Diagnosis not present

## 2022-03-27 DIAGNOSIS — L0291 Cutaneous abscess, unspecified: Secondary | ICD-10-CM | POA: Diagnosis not present

## 2022-03-27 DIAGNOSIS — K603 Anal fistula: Secondary | ICD-10-CM | POA: Diagnosis not present

## 2022-03-30 DIAGNOSIS — Z79899 Other long term (current) drug therapy: Secondary | ICD-10-CM | POA: Diagnosis not present

## 2022-04-07 DIAGNOSIS — Z79899 Other long term (current) drug therapy: Secondary | ICD-10-CM | POA: Diagnosis not present

## 2022-04-13 DIAGNOSIS — Z79899 Other long term (current) drug therapy: Secondary | ICD-10-CM | POA: Diagnosis not present

## 2022-04-19 DIAGNOSIS — L732 Hidradenitis suppurativa: Secondary | ICD-10-CM | POA: Diagnosis not present

## 2022-04-20 DIAGNOSIS — Z79899 Other long term (current) drug therapy: Secondary | ICD-10-CM | POA: Diagnosis not present

## 2022-04-29 DIAGNOSIS — Z79899 Other long term (current) drug therapy: Secondary | ICD-10-CM | POA: Diagnosis not present

## 2022-05-13 DIAGNOSIS — Z79899 Other long term (current) drug therapy: Secondary | ICD-10-CM | POA: Diagnosis not present

## 2022-06-01 DIAGNOSIS — Z79899 Other long term (current) drug therapy: Secondary | ICD-10-CM | POA: Diagnosis not present

## 2022-06-03 IMAGING — US US ABDOMEN COMPLETE
1 series · 13 of 25 positions shown · non-contrast
Comparison: 04/09/2009 abdominal sonogram.

CLINICAL DATA: Intractable nausea and vomiting.

EXAM:
ABDOMEN ULTRASOUND COMPLETE

[Series 1: us abdomen complete · 13 of 55 slices shown]
[im 1/55]
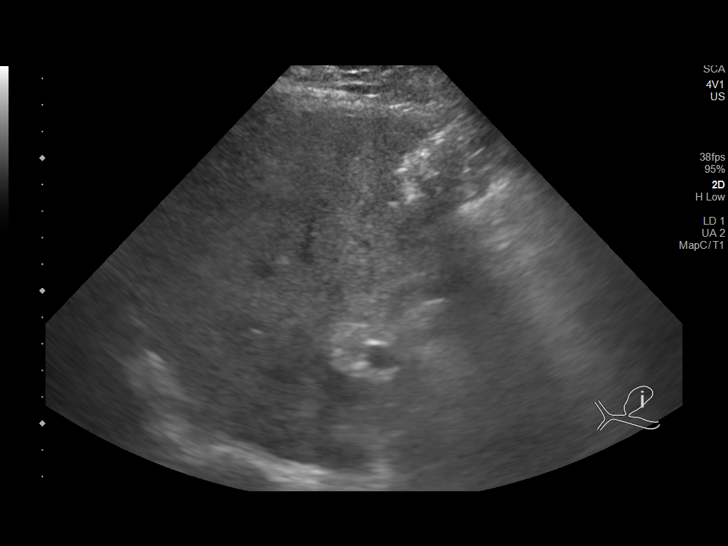
[im 5/55]
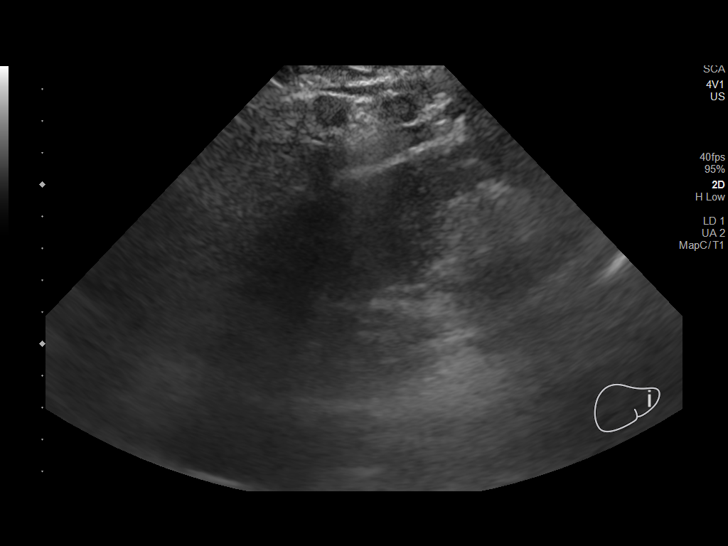
[im 10/55]
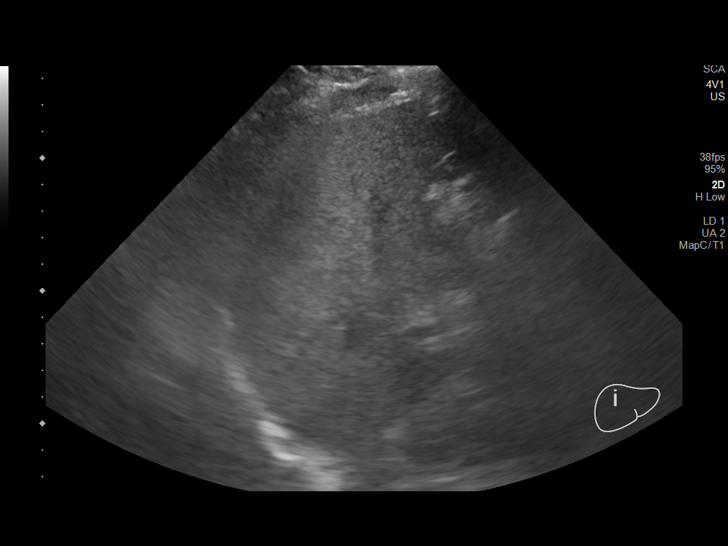
[im 14/55]
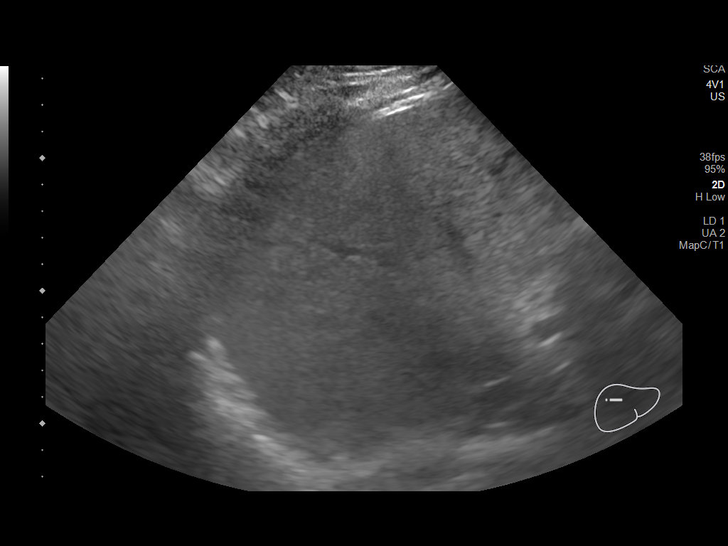
[im 19/55]
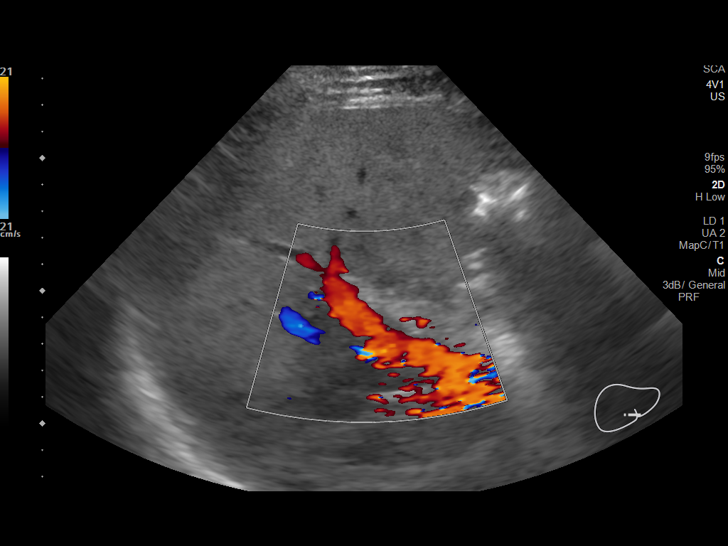
[im 23/55]
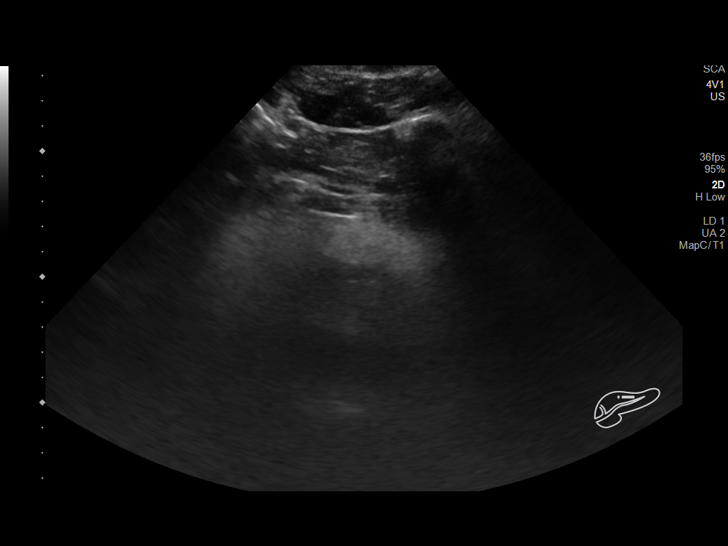
[im 28/55]
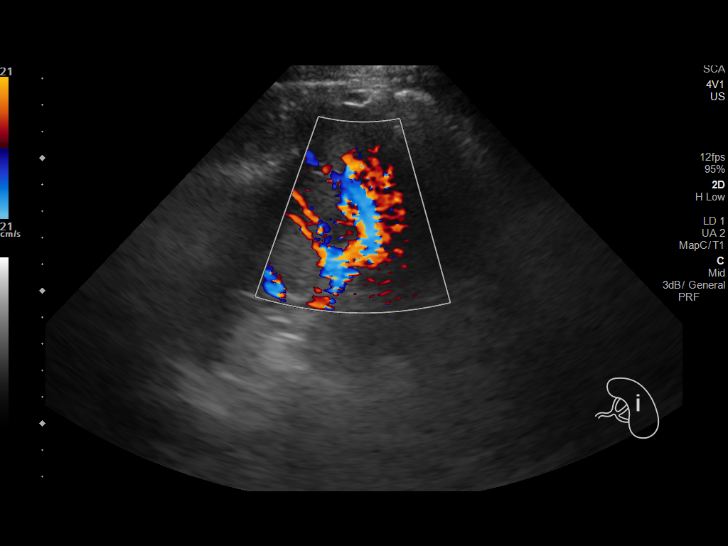
[im 32/55]
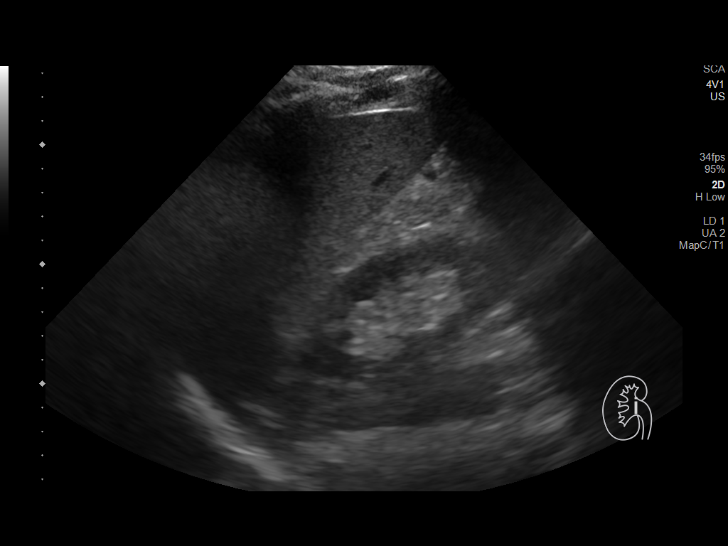
[im 37/55]
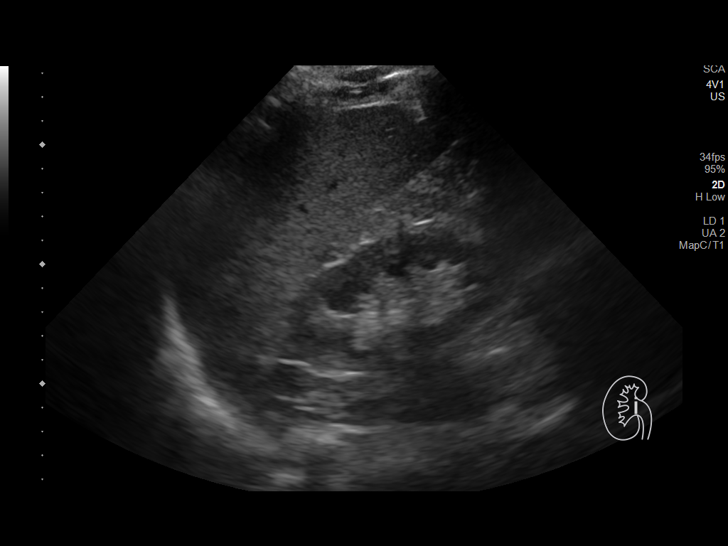
[im 41/55]
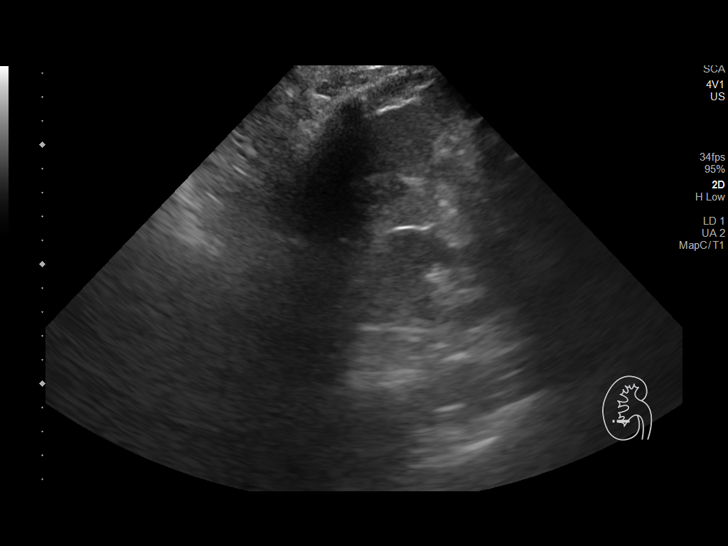
[im 46/55]
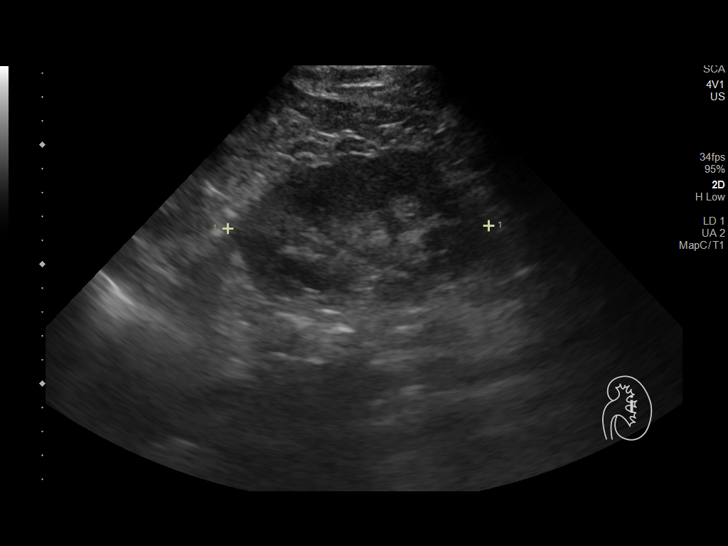
[im 50/55]
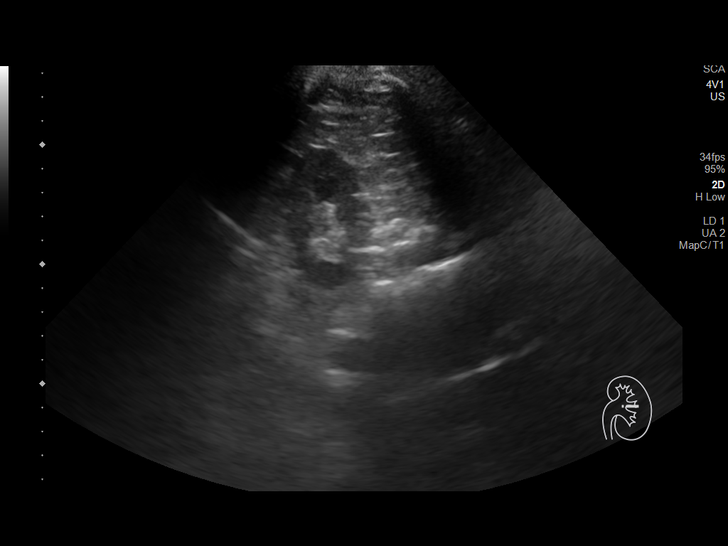
[im 55/55]
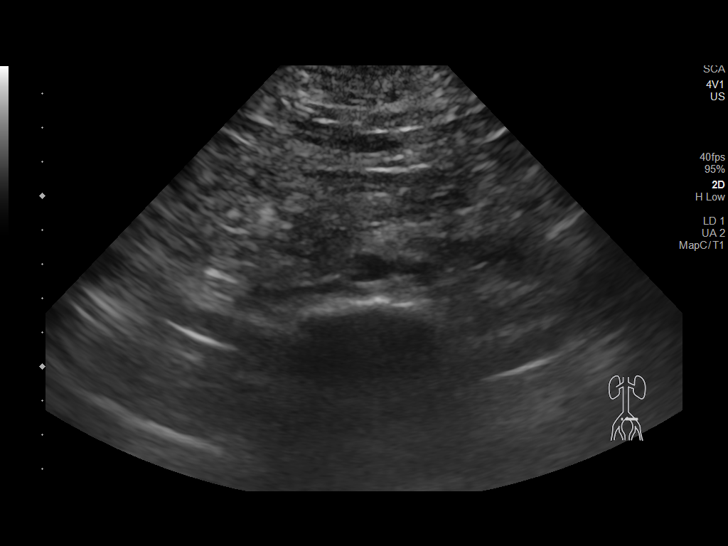

[13 of 25 positions shown; findings below may reference images not displayed]

FINDINGS: Gallbladder: Cholecystectomy.

Common bile duct: Diameter: 3 mm

Liver: Liver parenchyma is diffusely mildly echogenic. No definite
liver surface irregularity. No liver masses, noting decreased
sensitivity in the setting of an echogenic liver. Portal vein is
patent on color Doppler imaging with normal direction of blood flow
towards the liver.

IVC: No abnormality visualized.

Pancreas: Limited visualized portion unremarkable.

Spleen: Size and appearance within normal limits.

Right Kidney: Length: 10.1 cm. Echogenicity within normal limits. No
mass or hydronephrosis visualized.

Left Kidney: Length: 10.9 cm. Echogenicity within normal limits. No
mass or hydronephrosis visualized.

Abdominal aorta: No aneurysm visualized.

Other findings: None.
IMPRESSION: 1. No acute abnormality. No biliary ductal dilatation status post
cholecystectomy.
2. Echogenic liver parenchyma, a nonspecific finding that could be
due to hepatic steatosis and/or fibrosis. Suggest correlation with
liver function tests. Consider hepatic elastography for further
liver fibrosis risk stratification, as clinically warranted.

## 2022-06-15 DIAGNOSIS — Z79899 Other long term (current) drug therapy: Secondary | ICD-10-CM | POA: Diagnosis not present

## 2022-06-21 ENCOUNTER — Ambulatory Visit
Admission: RE | Admit: 2022-06-21 | Discharge: 2022-06-21 | Disposition: A | Discharge status: Home / Self Care | Payer: 59 | Source: Ambulatory Visit | Attending: Sports Medicine | Admitting: Sports Medicine

## 2022-06-21 ENCOUNTER — Other Ambulatory Visit: Payer: Self-pay | Admitting: Family Medicine

## 2022-06-21 DIAGNOSIS — K5900 Constipation, unspecified: Secondary | ICD-10-CM

## 2022-06-28 ENCOUNTER — Encounter (HOSPITAL_BASED_OUTPATIENT_CLINIC_OR_DEPARTMENT_OTHER): Payer: Self-pay | Admitting: Emergency Medicine

## 2022-06-28 ENCOUNTER — Emergency Department (HOSPITAL_BASED_OUTPATIENT_CLINIC_OR_DEPARTMENT_OTHER)
Admission: EM | Admit: 2022-06-28 | Discharge: 2022-06-28 | Disposition: A | Discharge status: Home / Self Care | Payer: 59 | Attending: Emergency Medicine | Admitting: Emergency Medicine

## 2022-06-28 ENCOUNTER — Other Ambulatory Visit: Payer: Self-pay

## 2022-06-28 DIAGNOSIS — K5900 Constipation, unspecified: Secondary | ICD-10-CM | POA: Insufficient documentation

## 2022-06-28 DIAGNOSIS — F79 Unspecified intellectual disabilities: Secondary | ICD-10-CM | POA: Diagnosis not present

## 2022-06-28 MED ORDER — LACTULOSE 10 GM/15ML PO SOLN: 10.0000 g | Freq: Two times a day (BID) | ORAL | 0 refills | Status: AC | PRN

## 2022-06-28 MED ORDER — FLEET ENEMA 7-19 GM/118ML RE ENEM
1.0000 | ENEMA | Freq: Once | RECTAL | Status: AC
  Administered 2022-06-28: 1 via RECTAL
  Filled 2022-06-28: qty 1

## 2022-06-28 NOTE — ED Notes (Signed)
Pt up to [facility] had 3 med size poop balls with liquid,

## 2022-06-28 NOTE — ED Triage Notes (Signed)
Patient here POV from Home.  Endorses Constipation for 2 weeks. Has had one BM in between 7 Days ago which was mainly Liquid. OTC Medication has not been very effective.   NAD Noted during Triage. Active and Alert.

## 2022-06-28 NOTE — ED Provider Notes (Signed)
[facility] Provider Note   CSN: [id number] Arrival date & time: 06/28/22  1631     History {Add pertinent medical, surgical, social history, OB history to HPI:1} Chief Complaint  Patient presents with   Constipation    Spencer Cummings is a 47 y.o. male.  The 5 caveat secondary to intellectual disability.  Patient resides at a group home and has a history of chronic constipation.  He is brought in by his mother for concerns of no bowel movement in the last 2 weeks.  She has had a 1 episode of watery diarrhea.  They have been continuing his bowel regiment medications along with adding fiber prunes.  He does not endorse any pain no vomiting no fevers.  The history is provided by the patient and a parent. The history is limited by a developmental delay.  Constipation Severity:  Moderate Time since last bowel movement:  2 weeks Timing:  Constant Progression:  Unchanged Chronicity:  New Stool description:  None produced Relieved by:  Nothing Worsened by:  Nothing Ineffective treatments:  Fiber Associated symptoms: no abdominal pain, no fever and no vomiting        Home Medications Prior to Admission medications   Medication Sig Start Date End Date Taking? Authorizing Provider  cloZAPine (CLOZARIL) 100 MG tablet Take 400 mg by mouth every evening. 07/12/17   [provider]  ondansetron (ZOFRAN ODT) 4 MG disintegrating tablet Take 1 tablet (4 mg total) by mouth every 8 (eight) hours as needed for nausea or vomiting. 07/19/20   Carlisle Cater, PA-C  pantoprazole (PROTONIX) 40 MG tablet Take 1 tablet (40 mg total) by mouth 2 (two) times daily. 12/09/19 01/08/20  Darliss Cheney, MD      Allergies    Lamictal [lamotrigine], Mellaril [thioridazine], and Zoloft [sertraline hcl]    Review of Systems   Review of Systems  Unable to perform ROS: Other  Constitutional:  Negative for fever.  Gastrointestinal:  Positive for constipation.  Negative for abdominal pain and vomiting.    Physical Exam Updated Vital Signs BP (!) 146/95 (BP Location: Right Arm)   Pulse 93   Temp 98.2 F (36.8 C) (Oral)   Resp 18   Ht '5\' 8"'$  (1.727 m)   Wt 90.7 kg   SpO2 100%   BMI 30.40 kg/m  Physical Exam Vitals and nursing note reviewed.  Constitutional:      General: He is not in acute distress.    Appearance: Normal appearance. He is well-developed.  HENT:     Head: Normocephalic and atraumatic.  Eyes:     Conjunctiva/sclera: Conjunctivae normal.  Cardiovascular:     Rate and Rhythm: Normal rate and regular rhythm.     Heart sounds: No murmur heard. Pulmonary:     Effort: Pulmonary effort is normal. No respiratory distress.     Breath sounds: Normal breath sounds.  Abdominal:     Palpations: Abdomen is soft.     Tenderness: There is no abdominal tenderness. There is no guarding or rebound.  Genitourinary:    Rectum: Normal.     Comments: Rectal exam done with nurse Jenny Reichmann as chaperone.  Normal tone no masses.  No stool in vault. Musculoskeletal:        General: No deformity.     Cervical back: Neck supple.  Skin:    General: Skin is warm and dry.     Capillary Refill: Capillary refill takes less than 2 seconds.  Neurological:  General: No focal deficit present.     Mental Status: He is alert.     ED Results / Procedures / Treatments   Labs (all labs ordered are listed, but only abnormal results are displayed) Labs Reviewed - No data to display  EKG None  Radiology No results found.  Procedures Procedures  {Document cardiac monitor, telemetry assessment procedure when appropriate:1}  Medications Ordered in ED Medications  sodium phosphate (FLEET) 7-19 GM/118ML enema 1 enema (has no administration in time range)    ED Course/ Medical Decision Making/ A&P   {   Click here for ABCD2, HEART and other calculatorsREFRESH Note before signing :1}                          Medical Decision Making Risk OTC  drugs.   This patient complains of ***; this involves an extensive number of treatment Options and is a complaint that carries with it a high risk of complications and morbidity. The differential includes ***  I ordered, reviewed and interpreted labs, which included *** I ordered medication *** and reviewed PMP when indicated. I ordered imaging studies which included *** and I independently    visualized and interpreted imaging which showed *** Additional history obtained from *** Previous records obtained and reviewed *** I consulted *** and discussed lab and imaging findings and discussed disposition.  Cardiac monitoring reviewed, *** Social determinants considered, *** Critical Interventions: ***  After the interventions stated above, I reevaluated the patient and found *** Admission and further testing considered, ***   {Document critical care time when appropriate:1} {Document review of labs and clinical decision tools ie heart score, Chads2Vasc2 etc:1}  {Document your independent review of radiology images, and any outside records:1} {Document your discussion with family members, caretakers, and with consultants:1} {Document social determinants of health affecting pt's care:1} {Document your decision making why or why not admission, treatments were needed:1} Final Clinical Impression(s) / ED Diagnoses Final diagnoses:  None    Rx / DC Orders ED Discharge Orders     None

## 2022-06-28 NOTE — ED Notes (Signed)
Warm fleet enema given , per pts mom he has nt had a bm x 2 weeks , states has given him miralax  1 scoop a day in OJ x 2 and  a suppositories

## 2022-06-29 DIAGNOSIS — Z79899 Other long term (current) drug therapy: Secondary | ICD-10-CM | POA: Diagnosis not present

## 2022-07-02 DIAGNOSIS — R8271 Bacteriuria: Secondary | ICD-10-CM | POA: Diagnosis not present

## 2022-07-13 DIAGNOSIS — Z79899 Other long term (current) drug therapy: Secondary | ICD-10-CM | POA: Diagnosis not present

## 2022-07-27 DIAGNOSIS — Z79899 Other long term (current) drug therapy: Secondary | ICD-10-CM | POA: Diagnosis not present

## 2022-08-09 DIAGNOSIS — K5904 Chronic idiopathic constipation: Secondary | ICD-10-CM | POA: Diagnosis not present

## 2022-08-09 DIAGNOSIS — Z1211 Encounter for screening for malignant neoplasm of colon: Secondary | ICD-10-CM | POA: Diagnosis not present

## 2022-08-27 DIAGNOSIS — Z79899 Other long term (current) drug therapy: Secondary | ICD-10-CM | POA: Diagnosis not present

## 2022-09-24 DIAGNOSIS — Z79899 Other long term (current) drug therapy: Secondary | ICD-10-CM | POA: Diagnosis not present

## 2022-10-29 DIAGNOSIS — Z79899 Other long term (current) drug therapy: Secondary | ICD-10-CM | POA: Diagnosis not present

## 2022-11-26 DIAGNOSIS — Z79899 Other long term (current) drug therapy: Secondary | ICD-10-CM | POA: Diagnosis not present

## 2022-12-31 DIAGNOSIS — Z79899 Other long term (current) drug therapy: Secondary | ICD-10-CM | POA: Diagnosis not present

## 2023-01-03 DIAGNOSIS — B0230 Zoster ocular disease, unspecified: Secondary | ICD-10-CM | POA: Diagnosis not present

## 2023-01-11 DIAGNOSIS — Z79899 Other long term (current) drug therapy: Secondary | ICD-10-CM | POA: Diagnosis not present

## 2023-01-17 DIAGNOSIS — Z0000 Encounter for general adult medical examination without abnormal findings: Secondary | ICD-10-CM | POA: Diagnosis not present

## 2023-01-17 DIAGNOSIS — K219 Gastro-esophageal reflux disease without esophagitis: Secondary | ICD-10-CM | POA: Diagnosis not present

## 2023-01-17 DIAGNOSIS — L732 Hidradenitis suppurativa: Secondary | ICD-10-CM | POA: Diagnosis not present

## 2023-01-28 DIAGNOSIS — Z79899 Other long term (current) drug therapy: Secondary | ICD-10-CM | POA: Diagnosis not present

## 2023-02-23 DIAGNOSIS — H01021 Squamous blepharitis right upper eyelid: Secondary | ICD-10-CM | POA: Diagnosis not present

## 2023-02-25 ENCOUNTER — Other Ambulatory Visit: Payer: Self-pay | Admitting: Gastroenterology

## 2023-02-25 DIAGNOSIS — Z79899 Other long term (current) drug therapy: Secondary | ICD-10-CM | POA: Diagnosis not present

## 2023-02-25 DIAGNOSIS — D7389 Other diseases of spleen: Secondary | ICD-10-CM

## 2023-02-25 DIAGNOSIS — R935 Abnormal findings on diagnostic imaging of other abdominal regions, including retroperitoneum: Secondary | ICD-10-CM

## 2023-03-14 ENCOUNTER — Ambulatory Visit
Admission: RE | Admit: 2023-03-14 | Discharge: 2023-03-14 | Disposition: A | Discharge status: Home / Self Care | Payer: 59 | Source: Ambulatory Visit | Attending: Gastroenterology

## 2023-03-14 DIAGNOSIS — D7389 Other diseases of spleen: Secondary | ICD-10-CM

## 2023-03-14 DIAGNOSIS — R935 Abnormal findings on diagnostic imaging of other abdominal regions, including retroperitoneum: Secondary | ICD-10-CM

## 2023-03-14 MED ORDER — GADOPICLENOL 0.5 MMOL/ML IV SOLN
9.0000 mL | Freq: Once | INTRAVENOUS | Status: AC | PRN
  Administered 2023-03-14: 9 mL via INTRAVENOUS

## 2023-03-24 DIAGNOSIS — Z79899 Other long term (current) drug therapy: Secondary | ICD-10-CM | POA: Diagnosis not present

## 2023-04-15 DIAGNOSIS — J069 Acute upper respiratory infection, unspecified: Secondary | ICD-10-CM | POA: Diagnosis not present

## 2023-04-22 DIAGNOSIS — Z79899 Other long term (current) drug therapy: Secondary | ICD-10-CM | POA: Diagnosis not present

## 2023-05-20 DIAGNOSIS — Z79899 Other long term (current) drug therapy: Secondary | ICD-10-CM | POA: Diagnosis not present

## 2023-06-21 DIAGNOSIS — Z79899 Other long term (current) drug therapy: Secondary | ICD-10-CM | POA: Diagnosis not present

## 2023-06-30 DIAGNOSIS — L0889 Other specified local infections of the skin and subcutaneous tissue: Secondary | ICD-10-CM | POA: Diagnosis not present

## 2023-06-30 DIAGNOSIS — D225 Melanocytic nevi of trunk: Secondary | ICD-10-CM | POA: Diagnosis not present

## 2023-06-30 DIAGNOSIS — D485 Neoplasm of uncertain behavior of skin: Secondary | ICD-10-CM | POA: Diagnosis not present

## 2023-06-30 DIAGNOSIS — L0231 Cutaneous abscess of buttock: Secondary | ICD-10-CM | POA: Diagnosis not present

## 2023-08-05 DIAGNOSIS — K5900 Constipation, unspecified: Secondary | ICD-10-CM | POA: Diagnosis not present

## 2023-08-05 DIAGNOSIS — K219 Gastro-esophageal reflux disease without esophagitis: Secondary | ICD-10-CM | POA: Diagnosis not present

## 2023-08-10 ENCOUNTER — Ambulatory Visit (INDEPENDENT_AMBULATORY_CARE_PROVIDER_SITE_OTHER): Admitting: Dermatology

## 2023-08-10 ENCOUNTER — Encounter: Payer: Self-pay | Admitting: Dermatology

## 2023-08-10 DIAGNOSIS — L720 Epidermal cyst: Secondary | ICD-10-CM

## 2023-08-10 DIAGNOSIS — L729 Follicular cyst of the skin and subcutaneous tissue, unspecified: Secondary | ICD-10-CM

## 2023-08-10 DIAGNOSIS — L821 Other seborrheic keratosis: Secondary | ICD-10-CM | POA: Diagnosis not present

## 2023-08-10 MED ORDER — DOXYCYCLINE HYCLATE 100 MG PO TABS: 100.0000 mg | ORAL_TABLET | Freq: Two times a day (BID) | ORAL | 0 refills | Status: AC

## 2023-08-10 MED ORDER — MUPIROCIN 2 % EX OINT: 1.0000 | TOPICAL_OINTMENT | Freq: Two times a day (BID) | CUTANEOUS | 5 refills | Status: AC

## 2023-08-10 NOTE — Progress Notes (Signed)
   New Patient Visit   Subjective  Spencer Cummings is a 48 y.o. male who presents for the following: New Pt - Cyst  Patient states he  has lesion located at the buttocks that he  would like to have examined. Patient reports the areas have been there for 6 months. He reports the areas are not bothersome.Patient rates irritation 0 out of 10. He states that the areas have not spread. Patient reports he  has previously been treated for these areas and was Rx Doxycycline for 14 days post I&D. Patient denied Hx of bx. Patient denied family history of skin cancer(s).   The following portions of the chart were reviewed this encounter and updated as appropriate: medications, allergies, medical history  Review of Systems:  No other skin or systemic complaints except as noted in HPI or Assessment and Plan.  Objective  Well appearing patient in no apparent distress; mood and affect are within normal limits.   A focused examination was performed of the following areas: R buttock   Relevant exam findings are noted in the Assessment and Plan.    Assessment & Plan    1. Recurrent gluteal cyst - Assessment: Chronic, recurrent cyst on the right gluteal region, present for at least 4 weeks with periodic enlargement. Currently 3 cm in size with no erythema or tenderness. Previously lanced and packed on February 27th, but packing prematurely removed. Fistula and hidradenitis suppurativa ruled out by specialist. Condition assessed as chronically clogged oil gland with potential for bacterial infection leading to abscess formation.  - Plan:    Daily cleansing with benzoyl peroxide antibacterial soap on bottom and groin    Apply mupirocin ointment or Vaseline daily to cyst opening    Prescription for doxycycline to be filled and kept on hand for flares    Take doxycycline with a large meal at onset of redness or pain in the area    Follow-up appointment in 2 months to assess efficacy of topical treatments     Return sooner if flare occurs to evaluate oral antibiotic effectiveness   SEBORRHEIC KERATOSIS - Stuck-on, waxy, tan-brown papules and/or plaques - Benign-appearing - Discussed benign etiology and prognosis. - Observe - Call for any changes       No follow-ups on file.    Documentation: I have reviewed the above documentation for accuracy and completeness, and I agree with the above.   I, Shirron Marcha Solders, CMA, am acting as scribe for [employer], DO.   Langston Reusing, DO

## 2023-08-10 NOTE — Patient Instructions (Addendum)
 Hello [place],  Thank you for visiting today. Here is a summary of the key instructions:  - Medications:   - Fill the doxycycline prescription and keep it at home   - Take doxycycline with a big meal when the cyst area gets red and painful  - Skin Care:   - Wash the bottom and groin area with CeraVe benzoyl peroxide antibacterial soap daily   - Apply mupirocin ointment or Vaseline to the cyst opening daily  - Follow-up:   - Return for a check-up in 2 months to assess the effectiveness of washes and topical treatments   - Schedule another appointment in 6 months for routine follow-up   - Come in sooner if there's a flare-up to evaluate how oral doxycycline worked  Please reach out if you have any questions or concerns.  Warm regards,  Dr. Langston Reusing, Dermatology      Important Information  Due to recent changes in healthcare laws, you may see results of your pathology and/or laboratory studies on MyChart before the doctors have had a chance to review them. We understand that in some cases there may be results that are confusing or concerning to you. Please understand that not all results are received at the same time and often the doctors may need to interpret multiple results in order to provide you with the best plan of care or course of treatment. Therefore, we ask that you please give [place] 2 business days to thoroughly review all your results before contacting the office for clarification. Should we see a critical lab result, you will be contacted sooner.   If You Need Anything After Your Visit  If you have any questions or concerns for your doctor, please call our main line at [phone] If no one answers, please leave a voicemail as directed and we will return your call as soon as possible. Messages left after 4 pm will be answered the following business day.   You may also send [place] a message via MyChart. We typically respond to MyChart messages within 1-2 business  days.  For prescription refills, please ask your pharmacy to contact our office. Our fax number is [phone].  If you have an urgent issue when the clinic is closed that cannot wait until the next business day, you can page your doctor at the number below.    Please note that while we do our best to be available for urgent issues outside of office hours, we are not available 24/7.   If you have an urgent issue and are unable to reach [place], you may choose to seek medical care at your doctor's office, retail clinic, urgent care center, or emergency room.  If you have a medical emergency, please immediately call 911 or go to the emergency department. In the event of inclement weather, please call our main line at [phone] for an update on the status of any delays or closures.  Dermatology Medication Tips: Please keep the boxes that topical medications come in in order to help keep track of the instructions about where and how to use these. Pharmacies typically print the medication instructions only on the boxes and not directly on the medication tubes.   If your medication is too expensive, please contact our office at [phone] or send [place] a message through MyChart.   We are unable to tell what your co-pay for medications will be in advance as this is different depending on your insurance coverage. However, we may be  able to find a substitute medication at lower cost or fill out paperwork to get insurance to cover a needed medication.   If a prior authorization is required to get your medication covered by your insurance company, please allow [place] 1-2 business days to complete this process.  Drug prices often vary depending on where the prescription is filled and some pharmacies may offer cheaper prices.  The website www.goodrx.com contains coupons for medications through different pharmacies. The prices here do not account for what the cost may be with help from insurance (it may be  cheaper with your insurance), but the website can give you the price if you did not use any insurance.  - You can print the associated coupon and take it with your prescription to the pharmacy.  - You may also stop by our office during regular business hours and pick up a GoodRx coupon card.  - If you need your prescription sent electronically to a different pharmacy, notify our office through [facility] or by phone at [phone]

## 2023-09-22 ENCOUNTER — Other Ambulatory Visit: Payer: Self-pay

## 2023-09-22 ENCOUNTER — Emergency Department (HOSPITAL_BASED_OUTPATIENT_CLINIC_OR_DEPARTMENT_OTHER)
Admission: EM | Admit: 2023-09-22 | Discharge: 2023-09-23 | Disposition: A | Discharge status: Home / Self Care | Attending: Emergency Medicine | Admitting: Emergency Medicine

## 2023-09-22 DIAGNOSIS — N3000 Acute cystitis without hematuria: Secondary | ICD-10-CM | POA: Insufficient documentation

## 2023-09-22 DIAGNOSIS — L02214 Cutaneous abscess of groin: Secondary | ICD-10-CM | POA: Insufficient documentation

## 2023-09-22 DIAGNOSIS — L0291 Cutaneous abscess, unspecified: Secondary | ICD-10-CM

## 2023-09-22 LAB — COMPREHENSIVE METABOLIC PANEL WITH GFR
ALT: 33 U/L (ref 0–44)
AST: 24 U/L (ref 15–41)
Albumin: 3.9 g/dL (ref 3.5–5.0)
Alkaline Phosphatase: 117 U/L (ref 38–126)
Anion gap: 13 (ref 5–15)
BUN: 16 mg/dL (ref 6–20)
CO2: 21 mmol/L — ABNORMAL LOW (ref 22–32)
Calcium: 9.6 mg/dL (ref 8.9–10.3)
Chloride: 105 mmol/L (ref 98–111)
Creatinine, Ser: 1.05 mg/dL (ref 0.61–1.24)
GFR, Estimated: >60 mL/min (ref >60)
Glucose, Bld: 124 mg/dL — ABNORMAL HIGH (ref 70–99)
Potassium: 3.9 mmol/L (ref 3.5–5.1)
Sodium: 139 mmol/L (ref 135–145)
Total Bilirubin: 0.2 mg/dL (ref 0.0–1.2)
Total Protein: 6.9 g/dL (ref 6.5–8.1)

## 2023-09-22 LAB — CBC WITH DIFFERENTIAL/PLATELET
Abs Immature Granulocytes: 0.13 10*3/uL — ABNORMAL HIGH (ref 0.00–0.07)
Basophils Absolute: 0.1 10*3/uL (ref 0.0–0.1)
Basophils Relative: 1 %
Eosinophils Absolute: 0.2 10*3/uL (ref 0.0–0.5)
Eosinophils Relative: 2 %
HCT: 44.9 % (ref 39.0–52.0)
Hemoglobin: 14.9 g/dL (ref 13.0–17.0)
Immature Granulocytes: 1 %
Lymphocytes Relative: 39 %
Lymphs Abs: 4.3 10*3/uL — ABNORMAL HIGH (ref 0.7–4.0)
MCH: 30 pg (ref 26.0–34.0)
MCHC: 33.2 g/dL (ref 30.0–36.0)
MCV: 90.3 fL (ref 80.0–100.0)
Monocytes Absolute: 0.8 10*3/uL (ref 0.1–1.0)
Monocytes Relative: 7 %
Neutro Abs: 5.6 10*3/uL (ref 1.7–7.7)
Neutrophils Relative %: 50 %
Platelets: 271 10*3/uL (ref 150–400)
RBC: 4.97 MIL/uL (ref 4.22–5.81)
RDW: 14.7 % (ref 11.5–15.5)
WBC: 11 10*3/uL — ABNORMAL HIGH (ref 4.0–10.5)
nRBC: 0 % (ref 0.0–0.2)

## 2023-09-22 LAB — URINALYSIS, W/ REFLEX TO CULTURE (INFECTION SUSPECTED)
Bilirubin Urine: NEGATIVE
Glucose, UA: NEGATIVE mg/dL
Hgb urine dipstick: NEGATIVE
Ketones, ur: NEGATIVE mg/dL
Nitrite: POSITIVE — AB
Specific Gravity, Urine: 1.021 (ref 1.005–1.030)
pH: 5.5 (ref 5.0–8.0)

## 2023-09-22 LAB — LACTIC ACID, PLASMA: Lactic Acid, Venous: 1.9 mmol/L (ref 0.5–1.9)

## 2023-09-22 MED ORDER — FOSFOMYCIN TROMETHAMINE 3 G PO PACK
3.0000 g | PACK | Freq: Once | ORAL | Status: AC
  Administered 2023-09-22: 3 g via ORAL
  Filled 2023-09-22: qty 3

## 2023-09-22 NOTE — ED Provider Notes (Signed)
 [place] EMERGENCY DEPARTMENT AT [facility] Provider Note   CSN: [id number] Arrival date & time: 09/22/23  2028     History  Chief Complaint  Patient presents with   Abscess   Level 5 caveat due to patient nonverbal Spencer Cummings is a 48 y.o. male.  The history is provided by a parent.  Patient with history of bipolar, cognitive delay presents with his mother who is his legal guardian.  Mother provides the entire history.  She reports that he has frequent abscesses.  Over the past day she has noted blood in his diaper coming from a wound in his left groin area.  He has also had previous abscesses to his buttocks.  He has doxycycline  at home given by his dermatologist but has not started as of yet.  No fevers or vomiting.    Past Medical History:  Diagnosis Date   Bipolar disorder (HCC)    Cognitive developmental delay    GERD (gastroesophageal reflux disease)     Home Medications Prior to Admission medications   Medication Sig Start Date End Date Taking? Authorizing Provider  cloZAPine  (CLOZARIL ) 100 MG tablet Take 400 mg by mouth every evening. 07/12/17   [provider]  lactulose  (CHRONULAC ) 10 GM/15ML solution Take 15 mLs (10 g total) by mouth 2 (two) times daily as needed for mild constipation. 06/28/22   Tonya Fredrickson, MD  mupirocin  ointment (BACTROBAN ) 2 % Apply 1 Application topically 2 (two) times daily. 08/10/23   Dellar Fenton, DO  ondansetron  (ZOFRAN  ODT) 4 MG disintegrating tablet Take 1 tablet (4 mg total) by mouth every 8 (eight) hours as needed for nausea or vomiting. 07/19/20   Geiple, Joshua, PA-C  pantoprazole  (PROTONIX ) 40 MG tablet Take 1 tablet (40 mg total) by mouth 2 (two) times daily. 12/09/19 01/08/20  Modena Andes, MD      Allergies    Lamictal [lamotrigine], Mellaril [thioridazine], and Zoloft [sertraline hcl]    Review of Systems   Review of Systems  Unable to perform ROS: Patient nonverbal    Physical Exam Updated Vital  Signs BP (!) 127/94 (BP Location: Right Arm)   Pulse (!) 106   Temp 98 F (36.7 C)   Resp 15   SpO2 95%  Physical Exam CONSTITUTIONAL: Chronically ill-appearing HEAD: Normocephalic/atraumatic ABDOMEN: soft, nontender GU: Small actively draining abscess noted to the left inguinal region.  Small amount of blood is noted.  No crepitus, no erythema.  No induration.  There is no erythema, no induration or abscess or crepitus noted to the scrotum. He has a small area of induration to the left buttock without erythema, no fluctuance or crepitus.  No signs of perirectal abscess There is no erythema, no crepitus or edema noted to the perineum Mother present for entire exam NEURO: Pt is awake/alert  ED Results / Procedures / Treatments   Labs (all labs ordered are listed, but only abnormal results are displayed) Labs Reviewed  COMPREHENSIVE METABOLIC PANEL WITH GFR - Abnormal; Notable for the following components:      Result Value   CO2 21 (*)    Glucose, Bld 124 (*)    All other components within normal limits  CBC WITH DIFFERENTIAL/PLATELET - Abnormal; Notable for the following components:   WBC 11.0 (*)    Lymphs Abs 4.3 (*)    Abs Immature Granulocytes 0.13 (*)    All other components within normal limits  URINALYSIS, W/ REFLEX TO CULTURE (INFECTION SUSPECTED) - Abnormal; Notable for  the following components:   Protein, ur TRACE (*)    Nitrite POSITIVE (*)    Leukocytes,Ua SMALL (*)    Bacteria, UA FEW (*)    All other components within normal limits  URINE CULTURE  LACTIC ACID, PLASMA    EKG None  Radiology No results found.  Procedures Procedures    Medications Ordered in ED Medications  fosfomycin (MONUROL) packet 3 g (has no administration in time range)    ED Course/ Medical Decision Making/ A&P                                 Medical Decision Making Amount and/or Complexity of Data Reviewed Labs: ordered.  Risk Prescription drug  management.   Patient with history of cognitive delay and frequent abscesses presents for draining abscess to left groin.  No indication for I&D, no indication for imaging.  He already has doxycycline  at home and will advise to start taking that twice a day for a week.  Also noted to have UTI, will give one-time dose of fosfomycin  Discussed wound care with mother Patient was previously in a group home that was closed, he now lives with parents        Final Clinical Impression(s) / ED Diagnoses Final diagnoses:  Acute cystitis without hematuria  Abscess    Rx / DC Orders ED Discharge Orders     None         Eldon Greenland, MD 09/22/23 2348

## 2023-09-22 NOTE — ED Triage Notes (Signed)
 Pt POV with mother, per mother she noticed a lump in his groin that is bleeding while taking pt to restroom, pressure applied with guaze PTA. Hx abscesses, assumes this is same.

## 2023-09-22 NOTE — ED Notes (Signed)
 Wound is dressed and wound care supplies have been given to patient's mother to take home

## 2023-09-25 LAB — URINE CULTURE: Culture: >=100000 — AB

## 2023-09-26 ENCOUNTER — Telehealth (HOSPITAL_BASED_OUTPATIENT_CLINIC_OR_DEPARTMENT_OTHER): Payer: Self-pay | Admitting: *Deleted

## 2023-09-26 NOTE — Telephone Encounter (Signed)
 Post ED Visit - Positive Culture Follow-up  Culture report reviewed by antimicrobial stewardship pharmacist: [facility] []  Court Distance, Pharm.D. []  Skeet Duke, [address].D., BCPS AQ-ID []  Leslee Rase, Pharm.D., BCPS []  Garland Junk, [address].D., BCPS []  [place], [place].D., BCPS, AAHIVP []  Alcide Aly, Pharm.D., BCPS, AAHIVP [x]  Jerri Morale, PharmD, BCPS []  Graham Laws, PharmD, BCPS []  Cleda Curly, PharmD, BCPS []  Tamar Fairly, PharmD []  Ballard Levels, PharmD, BCPS []  Ollen Beverage, PharmD  [facility] []  Arlyne Bering, PharmD []  Sherryle Don, PharmD []  Van Gelinas, PharmD []  Delila Felty, Rph []  [place]) Cleora Daft, PharmD []  Augustina Block, PharmD []  Arie Kurtz, PharmD []  Sharlyn Deaner, PharmD []  Agnes Hose, PharmD []  Kendall Pauls, PharmD []  Gladstone Lamer, PharmD []  Armanda Bern, PharmD []  Tera Fellows, PharmD   Positive urine culture Treated with fosfomycin x 1 in ed, organism sensitive to the same and no further patient follow-up is required at this time.  Georgine Kitchens 09/26/2023, 7:55 AM

## 2023-10-12 ENCOUNTER — Ambulatory Visit (INDEPENDENT_AMBULATORY_CARE_PROVIDER_SITE_OTHER): Admitting: Dermatology

## 2023-10-12 ENCOUNTER — Encounter: Payer: Self-pay | Admitting: Dermatology

## 2023-10-12 DIAGNOSIS — L732 Hidradenitis suppurativa: Secondary | ICD-10-CM | POA: Diagnosis not present

## 2023-10-12 MED ORDER — NYSTATIN 100000 UNIT/GM EX CREA: TOPICAL_CREAM | CUTANEOUS | 5 refills | Status: AC

## 2023-10-12 NOTE — Patient Instructions (Addendum)
 Date: Wed Oct 12 2023  Hello Spencer Cummings and Mom,  Thank you for visiting today. Here is a summary of the key instructions:  - Medications:   - Use doxycycline  for 1 week with food when cysts are active   - Stop using mupirocin  due to itching  - Skin Care:   - Continue using benzoyl peroxide cleanser   - Try Cicalfate cream by Avene (samples provided)   - If Cicalfate doesn't work, use Desitin   - Continue using Vaseline for redness in the gluteal area  - Follow-up:   - Contact the office if cyst flares become frequent   - Follow-up appointment in 6 months  Please reach out if you have any questions or concerns.  Warm regards,  Dr. Louana Roup, Dermatology     Important Information  Due to recent changes in healthcare laws, you may see results of your pathology and/or laboratory studies on MyChart before the doctors have had a chance to review them. We understand that in some cases there may be results that are confusing or concerning to you. Please understand that not all results are received at the same time and often the doctors may need to interpret multiple results in order to provide you with the best plan of care or course of treatment. Therefore, we ask that you please give us  2 business days to thoroughly review all your results before contacting the office for clarification. Should we see a critical lab result, you will be contacted sooner.   If You Need Anything After Your Visit  If you have any questions or concerns for your doctor, please call our main line at [phone] If no one answers, please leave a voicemail as directed and we will return your call as soon as possible. Messages left after 4 pm will be answered the following business day.   You may also send us  a message via MyChart. We typically respond to MyChart messages within 1-2 business days.  For prescription refills, please ask your pharmacy to contact our office. Our fax number is  [phone].  If you have an urgent issue when the clinic is closed that cannot wait until the next business day, you can page your doctor at the number below.    Please note that while we do our best to be available for urgent issues outside of office hours, we are not available 24/7.   If you have an urgent issue and are unable to reach us , you may choose to seek medical care at your doctor's office, retail clinic, urgent care center, or emergency room.  If you have a medical emergency, please immediately call 911 or go to the emergency department. In the event of inclement weather, please call our main line at [phone] for an update on the status of any delays or closures.  Dermatology Medication Tips: Please keep the boxes that topical medications come in in order to help keep track of the instructions about where and how to use these. Pharmacies typically print the medication instructions only on the boxes and not directly on the medication tubes.   If your medication is too expensive, please contact our office at [phone] or send us  a message through MyChart.   We are unable to tell what your co-pay for medications will be in advance as this is different depending on your insurance coverage. However, we may be able to find a substitute medication at lower cost or fill out paperwork to get insurance to cover  a needed medication.   If a prior authorization is required to get your medication covered by your insurance company, please allow us  1-2 business days to complete this process.  Drug prices often vary depending on where the prescription is filled and some pharmacies may offer cheaper prices.  The website www.goodrx.com contains coupons for medications through different pharmacies. The prices here do not account for what the cost may be with help from insurance (it may be cheaper with your insurance), but the website can give you the price if you did not use any insurance.  -  You can print the associated coupon and take it with your prescription to the pharmacy.  - You may also stop by our office during regular business hours and pick up a GoodRx coupon card.  - If you need your prescription sent electronically to a different pharmacy, notify our office through [facility] or by phone at [phone]

## 2023-10-12 NOTE — Progress Notes (Signed)
   Follow-Up Visit   Subjective  Spencer Cummings is a 48 y.o. male accompanied by mother who presents for the following: Cyst  Patient present today for follow up visit. Patient was last evaluated on 08/10/23. At this visit patient was prescribed:   Mupirocin  oint - apply daily Doxycycline  - take for flares Recommended washing the area with CeraVe BPO wash   Patient reports sxs are better. Patient denies medication changes. Mother stated that patient had a flare and was seen at the hospital on 09/22/23 for an abscess in the groin area that was was cleaned well and prescribed a 7 day course of Doxycycline . Mupirocin  was D/C due to causing irritation and itching in areas where it was used.   The following portions of the chart were reviewed this encounter and updated as appropriate: medications, allergies, medical history  Review of Systems:  No other skin or systemic complaints except as noted in HPI or Assessment and Plan.  Objective  Well appearing patient in no apparent distress; mood and affect are within normal limits.   A focused examination was performed of the following areas: buttocks & groin   Relevant exam findings are noted in the Assessment and Plan.    Assessment & Plan   HIDRADENITIS SUPPURATIVA Exam: Mild erythema in the gluteal crease, 2cm palpaple cystic leasion on R gluteal cleft now stable Hurley Stage 1: abscess formation (single or multiple) without sinus tracts or scarring  stable  Hidradenitis Suppurativa is a chronic; persistent; non-curable, but treatable condition due to abnormal inflamed sweat glands in the body folds (axilla, inframammary, groin, medial thighs), causing recurrent painful draining cysts and scarring. It can be associated with severe scarring acne and cysts; also abscesses and scarring of scalp. The goal is control and prevention of flares, as it is not curable. Scars are permanent and can be thickened. Treatment may include daily use of  topical medication and oral antibiotics.  Oral isotretinoin may also be helpful.  For some cases, Humira or Cosentyx (biologic injections) may be prescribed to decrease the inflammatory process and prevent flares.  When indicated, inflamed cysts may also be treated surgically.  Treatment Plan: - Recommended Zeasorb powder and Desitin to use in the affected areas. Avene Ciclfate with zinc oxide samples provided.  - Continue taking Doxycycline  for flares. Reminded pt's mother to give with heavy meal. - Continue using CeraVe BPO wash in affected areas - Rx Nystatin cream - Apply to affected areas BID PRN for redness and irritation    Return in about 6 months (around 04/12/2024) for HS.   Documentation: I have reviewed the above documentation for accuracy and completeness, and I agree with the above.  I, Shirron Louanne Roussel, CMA, am acting as scribe for [employer], DO.   Louana Roup, DO

## 2024-01-16 DIAGNOSIS — E782 Mixed hyperlipidemia: Secondary | ICD-10-CM | POA: Diagnosis not present

## 2024-01-16 DIAGNOSIS — Z79899 Other long term (current) drug therapy: Secondary | ICD-10-CM | POA: Diagnosis not present

## 2024-01-16 DIAGNOSIS — D72829 Elevated white blood cell count, unspecified: Secondary | ICD-10-CM | POA: Diagnosis not present

## 2024-01-19 DIAGNOSIS — Z0000 Encounter for general adult medical examination without abnormal findings: Secondary | ICD-10-CM | POA: Diagnosis not present

## 2024-01-19 DIAGNOSIS — K219 Gastro-esophageal reflux disease without esophagitis: Secondary | ICD-10-CM | POA: Diagnosis not present

## 2024-02-09 ENCOUNTER — Ambulatory Visit: Admitting: Dermatology

## 2024-04-01 ENCOUNTER — Ambulatory Visit: Admission: EM | Admit: 2024-04-01 | Discharge: 2024-04-01 | Disposition: A | Discharge status: Home / Self Care

## 2024-04-01 ENCOUNTER — Telehealth: Payer: Self-pay | Admitting: Emergency Medicine

## 2024-04-01 ENCOUNTER — Encounter: Payer: Self-pay | Admitting: Emergency Medicine

## 2024-04-01 DIAGNOSIS — L0231 Cutaneous abscess of buttock: Secondary | ICD-10-CM | POA: Diagnosis not present

## 2024-04-01 MED ORDER — SULFAMETHOXAZOLE-TRIMETHOPRIM 800-160 MG PO TABS: 1.0000 | ORAL_TABLET | Freq: Two times a day (BID) | ORAL | 0 refills | Status: DC

## 2024-04-01 NOTE — Discharge Instructions (Addendum)
 Continue with supportive treatment, warm compresses if he can.  Keep appointment with Dr. On December 9, if symptoms worsen significantly before December 9 I have sent in Bactrim to the pharmacy for you to start.  See purulent drainage coming from the area this is not an alarming thing this is a good thing.  Continue to use warm compresses and change bandages as needed until you can see your PCP.

## 2024-04-01 NOTE — Telephone Encounter (Signed)
 Open encounter to send most recent RX to diff pharm. No further action required.

## 2024-04-01 NOTE — ED Triage Notes (Signed)
 Mom st's pt has had a recurrent abscess in the coccyx area  St's has been getting bigger everyday  Also has a abscess in left groin  Pt has been on antibiotics

## 2024-04-02 DIAGNOSIS — L0231 Cutaneous abscess of buttock: Secondary | ICD-10-CM | POA: Diagnosis not present

## 2024-04-02 NOTE — ED Provider Notes (Signed)
 [name]    CSN: [id number] Arrival date & time: 04/01/24  1502      History   Chief Complaint Chief Complaint  Patient presents with   Abscess    HPI Spencer Cummings is a 48 y.o. male.   Pt presents today with mother due to abscess of right glute that has gradually increasing. Pt is seen by dermatology for Hidradenitis Suppurativa. Pt has recently completed a 7 day course of doxycycline  for another abscess that was drained by dermatologist and is healing well. Mom states that an area on his right glute where he has had an abscess previously has started to become larger and more erythematous over the last couple of days. Mom denies fever or chills from patient, also denies that patient has been showing signs that this area is irritating him or in pain. Pt states that they have a follow-up appointment with dermatology on 12/9 but is concerned and does not know what to do. Mom is hesitant for him to be on another antibiotic. He has had several courses in the last 6 months and she is concerned about resistance. Mom states that it is hard for patient to use warm compresses.   The history is provided by the patient.  Abscess   Past Medical History:  Diagnosis Date   Bipolar disorder [facility])    Cognitive developmental delay    GERD (gastroesophageal reflux disease)     Patient Active Problem List   Diagnosis Date Noted   Upper GI bleeding 12/07/2019   Tachycardia 12/07/2019   Developmental delay, profound 12/07/2019   Sebaceous cyst 09/18/2019   Hypokalemia 08/03/2017   Leucocytosis 08/03/2017   Hematemesis 08/02/2017    Past Surgical History:  Procedure Laterality Date   ESOPHAGOGASTRODUODENOSCOPY N/A 08/03/2017   Procedure: ESOPHAGOGASTRODUODENOSCOPY (EGD);  Surgeon: Celestia Agent, MD;  Location: [facility];  Service: Endoscopy;  Laterality: N/A;   LAPAROSCOPIC CHOLECYSTECTOMY  2011       Home Medications    Prior to Admission medications   Medication  Sig Start Date End Date Taking? Authorizing Provider  cloZAPine  (CLOZARIL ) 100 MG tablet Take 400 mg by mouth every evening. 07/12/17   [provider]  lactulose  (CHRONULAC ) 10 GM/15ML solution Take 15 mLs (10 g total) by mouth 2 (two) times daily as needed for mild constipation. Patient not taking: Reported on 04/01/2024 06/28/22   Spencer Ozell BROCKS, MD  mupirocin  ointment (BACTROBAN ) 2 % Apply 1 Application topically 2 (two) times daily. 08/10/23   Alm Delon SAILOR, DO  nystatin  cream (MYCOSTATIN ) Apply to affected areas of skin BID PRN for redness and irritation 10/12/23   Alm Delon SAILOR, DO  ondansetron  (ZOFRAN  ODT) 4 MG disintegrating tablet Take 1 tablet (4 mg total) by mouth every 8 (eight) hours as needed for nausea or vomiting. 07/19/20   Cummings, Joshua, PA-C  pantoprazole  (PROTONIX ) 40 MG tablet Take 1 tablet (40 mg total) by mouth 2 (two) times daily. 12/09/19 01/08/20  Cummings, Ravi, MD  sulfamethoxazole-trimethoprim (BACTRIM DS) 800-160 MG tablet Take 1 tablet by mouth 2 (two) times daily for 10 days. 04/01/24 04/11/24  Spencer Corean BROCKS, PA-C    Family History Family History  Problem Relation Age of Onset   GER disease Mother    GER disease Father    GI Bleed Neg Hx        Some question if patients great grandfather may have had GI Bleed, but not sure.    Social History Social History   Tobacco  Use   Smoking status: Never   Smokeless tobacco: Never  Vaping Use   Vaping status: Never Used  Substance Use Topics   Alcohol use: Never   Drug use: Never     Allergies   Lamictal [lamotrigine], Mellaril [thioridazine], and Zoloft [sertraline hcl]   Review of Systems Review of Systems   Physical Exam Triage Vital Signs ED Triage Vitals [04/01/24 1540]  Encounter Vitals Group     BP 127/88     Girls Systolic BP Percentile      Girls Diastolic BP Percentile      Boys Systolic BP Percentile      Boys Diastolic BP Percentile      Pulse Rate (!) 106      Resp 18     Temp (!) 97.3 F (36.3 C)     Temp Source Oral     SpO2 97 %     Weight      Height      Head Circumference      Peak Flow      Pain Score      Pain Loc      Pain Education      Exclude from Growth Chart    No data found.  Updated Vital Signs BP 127/88 (BP Location: Left Arm)   Pulse (!) 106   Temp (!) 97.3 F (36.3 C) (Oral)   Resp 18   SpO2 97%   Visual Acuity Right Eye Distance:   Left Eye Distance:   Bilateral Distance:    Right Eye Near:   Left Eye Near:    Bilateral Near:     Physical Exam Vitals and nursing note reviewed.  Constitutional:      General: He is not in acute distress.    Appearance: Normal appearance. He is not ill-appearing, toxic-appearing or diaphoretic.  Eyes:     General: No scleral icterus. Cardiovascular:     Rate and Rhythm: Normal rate and regular rhythm.     Heart sounds: Normal heart sounds.  Pulmonary:     Effort: Pulmonary effort is normal. No respiratory distress.     Breath sounds: Normal breath sounds. No wheezing or rhonchi.  Skin:    General: Skin is warm.         Comments: Raised, fluctuant, and erythema area noted of right glute, no induration present  Neurological:     Mental Status: He is alert.  Psychiatric:     Comments: Patient has cognitive disability       UC Treatments / Results  Labs (all labs ordered are listed, but only abnormal results are displayed) Labs Reviewed - No data to display  EKG   Radiology No results found.  Procedures Incision and Drainage  Date/Time: 04/02/2024 10:48 AM  Performed by: Spencer Corean BROCKS, PA-C Authorized by: Spencer Corean BROCKS, PA-C   Consent:    Consent obtained:  Verbal   Consent given by:  Parent   Risks discussed:  Bleeding and pain   Alternatives discussed:  No treatment Universal protocol:    Procedure explained and questions answered to patient or proxy's satisfaction: yes     Patient identity confirmed:  Verbally with  patient Location:    Type:  Abscess   Location:  Lower extremity   Lower extremity location:  Buttock   Buttock location:  R buttock Pre-procedure details:    Skin preparation:  Chlorhexidine Sedation:    Sedation type:  None Anesthesia:    Anesthesia method:  Topical application   Topical anesthesia: pain ease spray. Procedure type:    Complexity:  Simple Procedure details:    Ultrasound guidance: no     Needle aspiration: no     Incision types:  Stab incision   Incision depth:  Dermal   Drainage:  Bloody   Drainage amount:  Scant   Wound treatment:  Wound left open   Packing materials:  None Post-procedure details:    Procedure completion:  Tolerated well, no immediate complications Comments:     No purulent drainage was expressed when attempting to drain area, may be a cyst that needs to excised by dermatology  (including critical care time)  Medications Ordered in UC Medications - No data to display  Initial Impression / Assessment and Plan / UC Course  I have reviewed the triage vital signs and the nursing notes.  Pertinent labs & imaging results that were available during my care of the patient were reviewed by me and considered in my medical decision making (see chart for details).     Final Clinical Impressions(s) / UC Diagnoses   Final diagnoses:  Abscess of buttock, right     Discharge Instructions      Continue with supportive treatment, warm compresses if he can.  Keep appointment with Dr. On December 9, if symptoms worsen significantly before December 9 I have sent in Bactrim to the pharmacy for you to start.  See purulent drainage coming from the area this is not an alarming thing this is a good thing.  Continue to use warm compresses and change bandages as needed until you can see your PCP.    ED Prescriptions     Medication Sig Dispense Auth. Provider   sulfamethoxazole-trimethoprim (BACTRIM DS) 800-160 MG tablet Take 1 tablet by mouth 2 (two)  times daily for 10 days. 20 tablet Spencer Corean BROCKS, PA-C      PDMP not reviewed this encounter.   Spencer Corean BROCKS, PA-C 04/02/24 1051

## 2024-04-07 ENCOUNTER — Encounter: Payer: Self-pay | Admitting: Emergency Medicine

## 2024-04-07 ENCOUNTER — Ambulatory Visit
Admission: EM | Admit: 2024-04-07 | Discharge: 2024-04-07 | Disposition: A | Discharge status: Home / Self Care | Attending: Nurse Practitioner | Admitting: Nurse Practitioner

## 2024-04-07 DIAGNOSIS — L732 Hidradenitis suppurativa: Secondary | ICD-10-CM | POA: Diagnosis not present

## 2024-04-07 DIAGNOSIS — L0231 Cutaneous abscess of buttock: Secondary | ICD-10-CM | POA: Diagnosis not present

## 2024-04-07 MED ORDER — IBUPROFEN 800 MG PO TABS: 800.0000 mg | ORAL_TABLET | Freq: Three times a day (TID) | ORAL | 0 refills | Status: AC | PRN

## 2024-04-07 NOTE — ED Triage Notes (Addendum)
 Pt has recurrent abscess in near coccyx. He went to [facility] on 11/30 and they attempted to drain but didn't get anything out.  He has been on multiple abx for abscesses.  Mother states she noticed this morning he has new abscess in groin area.

## 2024-04-07 NOTE — ED Provider Notes (Signed)
 [facility]    CSN: [id number] Arrival date & time: 04/07/24  9062      History   Chief Complaint Chief Complaint  Patient presents with   Abscess    HPI Spencer Cummings is a 48 y.o. male.   Verbal consent was obtained from the patient's mother and legal guardian to utilize AI scribe software for clinical note transcription. History is provided by the patient's mother due to the patient's history of bipolar disorder and cognitive developmental delay.  The patient presents for evaluation of chronic, recurrent cysts involving the right gluteal region, previously diagnosed as hidradenitis suppurativa. He has experienced multiple episodes requiring medical intervention. Initial management included lancing of a buttock lesion with wick placement, which the patient removed prematurely. On subsequent evaluation, there was concern for a possible fistula requiring surgical consultation; however, the specialist confirmed the diagnosis of hidradenitis suppurativa.  The patient established care with Dr. Alm in dermatology in April 2025 following an earlier procedure performed at [facility] by Dr. Lynnell in February, at which time the lesion was noted to be approaching the threshold for surgical rather than office-based management. In May, the patient developed a groin lesion that was associated with significant bleeding and was treated with doxycycline . Most recently, he completed a 7-day course of doxycycline  on November 26. Six days ago, on November 30, he was evaluated at urgent care for the right gluteal lesion, where a stab incision was performed with only scant, bloody drainage and no purulence; therefore, no wound culture was obtained at that time. He was advised to use warm compresses and was prescribed Bactrim , though this was not started until December 5.  The patient's mother describes the current right gluteal lesion as the worst yet and notes it is causing him  noticeable discomfort, which is atypical as he usually does not complain of pain. She also reports the recent development of smaller lesions in the left groin. Management has been further complicated by the patient's urinary incontinence, making it difficult to keep the area dry and avoid skin irritation. She denies any fevers, vomiting, or diarrhea and reports that his appetite remains normal. He has a scheduled follow-up appointment with Dr. Alm in dermatology on Tuesday.  The following sections of the patient's history were reviewed and updated as appropriate: allergies, current medications, past family history, past medical history, past social history, past surgical history, and problem list.            Past Medical History:  Diagnosis Date   Bipolar disorder (HCC)    Cognitive developmental delay    GERD (gastroesophageal reflux disease)     Patient Active Problem List   Diagnosis Date Noted   Upper GI bleeding 12/07/2019   Tachycardia 12/07/2019   Developmental delay, profound 12/07/2019   Sebaceous cyst 09/18/2019   Hypokalemia 08/03/2017   Leucocytosis 08/03/2017   Hematemesis 08/02/2017    Past Surgical History:  Procedure Laterality Date   ESOPHAGOGASTRODUODENOSCOPY N/A 08/03/2017   Procedure: ESOPHAGOGASTRODUODENOSCOPY (EGD);  Surgeon: Celestia Agent, MD;  Location: [facility];  Service: Endoscopy;  Laterality: N/A;   LAPAROSCOPIC CHOLECYSTECTOMY  2011       Home Medications    Prior to Admission medications   Medication Sig Start Date End Date Taking? Authorizing Provider  ibuprofen  (ADVIL ) 800 MG tablet Take 1 tablet (800 mg total) by mouth every 8 (eight) hours as needed (pain). Take with food to avoid stomach upset. Do not take any additional NSAIDs while on this. You  may take tylenol  in addition to this if needed for extra pain relief. 04/07/24  Yes Iola Lukes, FNP  cloZAPine  (CLOZARIL ) 100 MG tablet Take 400 mg by mouth every evening. 07/12/17    [provider]  lactulose  (CHRONULAC ) 10 GM/15ML solution Take 15 mLs (10 g total) by mouth 2 (two) times daily as needed for mild constipation. Patient not taking: Reported on 04/01/2024 06/28/22   Towana Ozell BROCKS, MD  mupirocin  ointment (BACTROBAN ) 2 % Apply 1 Application topically 2 (two) times daily. 08/10/23   Alm Delon SAILOR, DO  nystatin  cream (MYCOSTATIN ) Apply to affected areas of skin BID PRN for redness and irritation 10/12/23   Alm Delon SAILOR, DO  ondansetron  (ZOFRAN  ODT) 4 MG disintegrating tablet Take 1 tablet (4 mg total) by mouth every 8 (eight) hours as needed for nausea or vomiting. 07/19/20   Geiple, Joshua, PA-C  pantoprazole  (PROTONIX ) 40 MG tablet Take 1 tablet (40 mg total) by mouth 2 (two) times daily. 12/09/19 01/08/20  Vernon Ranks, MD  sulfamethoxazole -trimethoprim  (BACTRIM  DS) 800-160 MG tablet Take 1 tablet by mouth 2 (two) times daily for 10 days. 04/01/24 04/11/24  Andra Corean BROCKS, PA-C    Family History Family History  Problem Relation Age of Onset   GER disease Mother    GER disease Father    GI Bleed Neg Hx        Some question if patients great grandfather may have had GI Bleed, but not sure.    Social History Social History   Tobacco Use   Smoking status: Never   Smokeless tobacco: Never  Vaping Use   Vaping status: Never Used  Substance Use Topics   Alcohol use: Never   Drug use: Never     Allergies   Lamictal [lamotrigine], Mellaril [thioridazine], and Zoloft [sertraline hcl]   Review of Systems Review of Systems  Constitutional:  Negative for appetite change and fever.  Gastrointestinal:  Negative for diarrhea and vomiting.  Genitourinary:        Chronic urinary incontinence   Skin:  Positive for wound.  All other systems reviewed and are negative.    Physical Exam Triage Vital Signs ED Triage Vitals  Encounter Vitals Group     BP 04/07/24 1009 120/88     Girls Systolic BP Percentile --      Girls Diastolic BP  Percentile --      Boys Systolic BP Percentile --      Boys Diastolic BP Percentile --      Pulse Rate 04/07/24 1009 (!) 105     Resp 04/07/24 1009 17     Temp 04/07/24 1017 98.3 F (36.8 C)     Temp Source 04/07/24 1017 Temporal     SpO2 04/07/24 1009 96 %     Weight --      Height --      Head Circumference --      Peak Flow --      Pain Score --      Pain Loc --      Pain Education --      Exclude from Growth Chart --    No data found.  Updated Vital Signs BP 120/88 (BP Location: Right Arm)   Pulse (!) 105   Temp 98.3 F (36.8 C) (Temporal)   Resp 17   SpO2 96%   Visual Acuity Right Eye Distance:   Left Eye Distance:   Bilateral Distance:    Right Eye Near:   Left  Eye Near:    Bilateral Near:     Physical Exam Vitals reviewed. Exam conducted with a chaperone present Sidra D., RN).  Constitutional:      General: He is awake. He is not in acute distress.    Appearance: Normal appearance. He is well-developed. He is not ill-appearing, toxic-appearing or diaphoretic.  HENT:     Head: Normocephalic.     Right Ear: Hearing normal.     Left Ear: Hearing normal.     Nose: Nose normal.     Mouth/Throat:     Mouth: Mucous membranes are moist.  Eyes:     General: Vision grossly intact.     Conjunctiva/sclera: Conjunctivae normal.  Cardiovascular:     Rate and Rhythm: Normal rate and regular rhythm.     Heart sounds: Normal heart sounds.  Pulmonary:     Effort: Pulmonary effort is normal.     Breath sounds: Normal breath sounds and air entry.  Musculoskeletal:        General: Normal range of motion.     Cervical back: Normal range of motion and neck supple.  Lymphadenopathy:     Cervical: No cervical adenopathy.  Skin:    General: Skin is warm and dry.     Findings: Abscess present.     Comments: See below   Neurological:     Mental Status: He is alert. Mental status is at baseline.         Left groin has three superficial inflammatory lesions  clustered along the inguinal fold. The most prominent lesion measures approximately 0.5-0.8 cm, appearing raised with a central dark punctum. Surrounding skin shows mild erythema with minimal localized edema. Additional adjacent papular lesions are present without evidence of fluctuance, drainage, or abscess formation.    Large erythematous, fluctuant abscess is present on the right buttock measuring approximately 5  4 cm in diameter. Surrounding induration and warmth are noted with visible overlying skin tension. No active bleeding or spontaneous drainage is present. Palpation confirms a central area of fluctuance with firm, indurated margins and focal tenderness to touch.  UC Treatments / Results  Labs (all labs ordered are listed, but only abnormal results are displayed) Labs Reviewed  AEROBIC CULTURE W GRAM STAIN (SUPERFICIAL SPECIMEN)    EKG   Radiology No results found.  Procedures Incision and Drainage  Date/Time: 04/07/2024 10:54 AM  Performed by: Iola Lukes, FNP Authorized by: Iola Lukes, FNP   Consent:    Consent obtained:  Verbal   Consent given by:  Parent   Risks, benefits, and alternatives were discussed: yes     Risks discussed:  Bleeding, incomplete drainage, pain and infection   Alternatives discussed:  No treatment and observation Universal protocol:    Procedure explained and questions answered to patient or proxy's satisfaction: yes     Patient identity confirmed:  Arm band Location:    Type:  Abscess   Location:  Lower extremity   Lower extremity location:  Buttock   Buttock location:  R buttock Pre-procedure details:    Skin preparation:  Povidone-iodine and chlorhexidine Sedation:    Sedation type:  None Anesthesia:    Anesthesia method:  Local infiltration   Local anesthetic:  Lidocaine  1% w/o epi Procedure type:    Complexity:  Complex Procedure details:    Incision types:  Single straight   Wound management:  Probed and  deloculated, irrigated with saline and extensive cleaning   Drainage:  Purulent   Drainage amount:  Copious  Wound treatment:  Drain placed   Packing materials:  1/2 in iodoform gauze Post-procedure details:    Procedure completion:  Tolerated well, no immediate complications   Post-I&D: Wound is packed and secured. Surrounding erythema has been outlined for monitoring and measures approximately 10.5  10 cm in diameter.  Medications Ordered in UC Medications - No data to display  Initial Impression / Assessment and Plan / UC Course  I have reviewed the triage vital signs and the nursing notes.  Pertinent labs & imaging results that were available during my care of the patient were reviewed by me and considered in my medical decision making (see chart for details).    The patient presents with chronic, recurrent hidradenitis suppurativa involving the right gluteal region with an actively infected, fluctuant abscess and additional early lesions in the left groin. The patient is at his reported mental baseline, afebrile, nontoxic appearing, and with stable vital signs. Given the extent of the current gluteal lesion, procedural intervention was indicated. The right gluteal abscess was treated today with incision and drainage, yielding copious foul-smelling purulent material with tunneling and loculations that were identified and fully evacuated. A wound culture was obtained to guide targeted antimicrobial therapy. The patient will continue Bactrim  while awaiting culture results, with antibiotic adjustments to be made based on sensitivities if needed. Packing remains in place and should be left undisturbed until his scheduled dermatology follow-up in three days. Mother was advised to continue close follow-up with dermatology and primary care for ongoing management and long-term suppression strategies for hidradenitis. Emergency evaluation was advised for worsening or uncontrolled pain, spreading  erythema, increasing swelling, development of fever or chills, signs of systemic illness, inability to tolerate oral medications, or any rapid clinical deterioration.  Today's evaluation has revealed no signs of a dangerous process. Discussed diagnosis with patient and/or guardian. Patient and/or guardian aware of their diagnosis, possible red flag symptoms to watch out for and need for close follow up. Patient and/or guardian understands verbal and written discharge instructions. Patient and/or guardian comfortable with plan and disposition.  Patient and/or guardian has a clear mental status at this time, good insight into illness (after discussion and teaching) and has clear judgment to make decisions regarding their care  Documentation was completed with the aid of voice recognition software. Transcription may contain typographical errors.  Final Clinical Impressions(s) / UC Diagnoses   Final diagnoses:  Hidradenitis suppurativa  Abscess, gluteal, right     Discharge Instructions      Tavone was seen today for an active flare of hidradenitis suppurativa on his right gluteal area, which is a chronic skin condition that causes recurrent painful abscesses. The large infected area was opened and drained in the clinic, removing a significant amount of infected fluid, and a wound culture was taken to help guide antibiotic treatment. He should continue taking Bactrim  exactly as prescribed until culture results are available, at which time the medication may be adjusted if needed. The packing placed in the wound should remain in place until his scheduled dermatology appointment on Tuesday, Dec. 9th.  Keep the area clean and dry, avoid pressure or friction over the site, and change outer dressings as needed if there is drainage or soaking. Warm compresses to surrounding areas can help ease discomfort, and the prescribed ibuprofen  may be used as needed for any pain. While taking this medication, do not use  over-the-counter anti-inflammatories such as aspirin, Motrin , ibuprofen , or Aleve, as this may increase the risk of side effects.  If needed, you may take Tylenol  (acetaminophen ) 1000 mg every six hours for additional pain relief. This equals two 500 mg tablets at a time. Be careful not to take more than 4000 mg of Tylenol  in a 24-hour period.  Seek emergency care right away if Tiran develops a fever, chills, rapidly spreading redness, worsening swelling, increasing pain, foul-smelling or persistent drainage, new areas of redness spreading beyond the wound, or symptoms of systemic illness.     ED Prescriptions     Medication Sig Dispense Auth. Provider   ibuprofen  (ADVIL ) 800 MG tablet Take 1 tablet (800 mg total) by mouth every 8 (eight) hours as needed (pain). Take with food to avoid stomach upset. Do not take any additional NSAIDs while on this. You may take tylenol  in addition to this if needed for extra pain relief. 21 tablet Iola Lukes, FNP      PDMP not reviewed this encounter.   Iola Lukes, [place] 04/07/24 1158

## 2024-04-07 NOTE — Discharge Instructions (Addendum)
 Spencer Cummings was seen today for an active flare of hidradenitis suppurativa on his right gluteal area, which is a chronic skin condition that causes recurrent painful abscesses. The large infected area was opened and drained in the clinic, removing a significant amount of infected fluid, and a wound culture was taken to help guide antibiotic treatment. He should continue taking Bactrim  exactly as prescribed until culture results are available, at which time the medication may be adjusted if needed. The packing placed in the wound should remain in place until his scheduled dermatology appointment on Tuesday, Dec. 9th.  Keep the area clean and dry, avoid pressure or friction over the site, and change outer dressings as needed if there is drainage or soaking. Warm compresses to surrounding areas can help ease discomfort, and the prescribed ibuprofen  may be used as needed for any pain. While taking this medication, do not use over-the-counter anti-inflammatories such as aspirin, Motrin , ibuprofen , or Aleve, as this may increase the risk of side effects. If needed, you may take Tylenol  (acetaminophen ) 1000 mg every six hours for additional pain relief. This equals two 500 mg tablets at a time. Be careful not to take more than 4000 mg of Tylenol  in a 24-hour period.  Seek emergency care right away if Spencer Cummings develops a fever, chills, rapidly spreading redness, worsening swelling, increasing pain, foul-smelling or persistent drainage, new areas of redness spreading beyond the wound, or symptoms of systemic illness.

## 2024-04-08 ENCOUNTER — Telehealth: Payer: Self-pay | Admitting: Emergency Medicine

## 2024-04-08 NOTE — Telephone Encounter (Signed)
 Pt mother called asking what she should do because dressing fell off/ Per provider Lucie Severe pt mother should keep area dry and covered until dermatology visit on Tuesday.

## 2024-04-09 ENCOUNTER — Ambulatory Visit: Admitting: Dermatology

## 2024-04-09 ENCOUNTER — Encounter: Payer: Self-pay | Admitting: Dermatology

## 2024-04-09 VITALS — BP 127/102 | HR 101

## 2024-04-09 DIAGNOSIS — L732 Hidradenitis suppurativa: Secondary | ICD-10-CM

## 2024-04-09 MED ORDER — SULFAMETHOXAZOLE-TRIMETHOPRIM 800-160 MG PO TABS: ORAL_TABLET | ORAL | 4 refills | Status: AC

## 2024-04-09 NOTE — Progress Notes (Signed)
 Follow-Up Visit  Patient (and/or pt guardian) consented to the use of AI-assisted tools for note generation.    Subjective  Spencer Cummings is a 48 y.o. male who presents for the following: HS - patient is accompanied by mom, [place]  Patient was last evaluated on 10/12/23.  At this visit patient was recommended to use Zeasorb powder and Desitin on affected areas Advised to continue taking Doxycycline  for flares. Continue using CeraVe BPO wash in affected areas Prescribed Nystatin  cream to use on affected areas BID PRN for redness and irritation  Patient reports sxs are unchanged Patient denies medication changes. Patient reports no flare ups for several months until larger spots appeared in November. Patient reports going to urgent care on 04/01/24 and 04/07/24 to have a spot on buttocks drained and patient was prescribed and spot is currently packed with a wound dressing and Patient is currently taking Bactrim   Patient repots he has not used Nystatin  cream as he does not feel symptoms are yeast related  Patient reports not using Zeasorb powder and reports not using Desitin.  Patient reports using Doxycycline  at signs of flare but it does not seem to be helping Patient reports not washing with benzoyl peroxide every day  The following portions of the chart were reviewed this encounter and updated as appropriate: medications, allergies, medical history  Review of Systems:  No other skin or systemic complaints except as noted in HPI or Assessment and Plan.  Objective  Well appearing patient in no apparent distress; mood and affect are within normal limits.  A focused examination was performed of the following areas:   Relevant exam findings are noted in the Assessment and Plan.    Assessment & Plan  HIDRADENITIS SUPPURATIVA Exam: Buttocks  Flared  Chronic hidradenitis suppurativa with recurrent flares, including recent severe flare on the right buttock requiring urgent care.  Previous flares managed with doxycycline  and Bactrim . Discussion on potential surgical intervention for cyst removal, though not guaranteed to prevent recurrence. Consideration of Cosentyx for long-term management to control inflammation and reduce flare frequency. Risks of antibiotic resistance discussed, but benefits of doxycycline  for its anti-inflammatory properties outweigh risks. Cosentyx considered as a next step to reduce reliance on antibiotics and manage inflammation effectively. Discussed dietary triggers including dairy, sugar, and carbohydrates, and the importance of maintaining a healthy diet to manage inflammation.  Treatment Plan: Advised to leave packing in place until it falls out on its own Advised to make sure to use Benzoyl Peroxide wash daily  Advised to use Desitin cream on areas every morning and every night  Continue with current prescription of Bactrim  from urgent care visit and Prescribed Bactrim  DS 800-160 mg to take at sign of flare up (1 tablet twice daily at sign of flare up for 7 days)  Recommended use of antibiotic ointment to area  Discussed starting Cosentyx - information given to patient's mother and labs for TB and Hep panel were ordered   Pre-visit planning reviewing the last office visit, labs, imaging and care everywhere when applicable was 8 minutes Intra-visit was 30 minutes and included updating the relevant history, performing a video or in-person physical exam as appropriate, creating a treatment plan and used shared decision making with the patient.  Post-visit was 8 minutes that encompassed note completion, placing of orders, updating patient instructions and coordination of care.   HIDRADENITIS SUPPURATIVA   Related Procedures QuantiFERON-TB Gold Plus Hepatic Function Panel Related Medications sulfamethoxazole -trimethoprim  (BACTRIM  DS) 800-160 MG tablet Take 1 tablet  by mouth twice daily for 7 days at start of flare up  Return in about 2  months (around 06/10/2024) for HS F/U.  I, Lyle Cords, as acting as a neurosurgeon for [employer], DO .   Documentation: I have reviewed the above documentation for accuracy and completeness, and I agree with the above.  Delon Lenis, DO

## 2024-04-09 NOTE — Patient Instructions (Addendum)
 VISIT SUMMARY:  Today, we discussed your recent flare-up of hidradenitis suppurativa, which has caused inflamed cysts on your groin and right buttock. We reviewed your current treatment and explored additional options to manage your condition more effectively.  YOUR PLAN:  -HIDRADENITIS SUPPURATIVA:  Hidradenitis suppurativa is a chronic skin condition that causes painful, inflamed cysts and secondary abscesses.   To manage your current flare, continue taking Bactrim  DS 800 mg twice a day for 10 days.   Use benzoyl peroxide cleanser regularly to reduce bacteria on your skin, and apply Desitin in the morning and at night to protect your skin.   Consider taking Epsom salt baths to help reduce inflammation.   We also discussed the possibility of starting Cosentyx, a medication that can help control inflammation and reduce the frequency of flares. We will need to conduct TB and hepatitis tests to see if you are eligible for Cosentyx.   Additionally, maintaining a healthy diet by avoiding dairy, sugar, and carbohydrates can help manage your condition.   We will follow up in two months to assess how well the treatment is working and to discuss starting Cosentyx. Information about Cosentyx has been provided for your education.  INSTRUCTIONS:  Continue taking Bactrim  DS 800 mg twice a day for 10 days. Use benzoyl peroxide cleanser regularly and apply Desitin in the morning and at night. Consider Epsom salt baths to reduce inflammation. We will conduct TB and hepatitis tests to evaluate your eligibility for Cosentyx. Follow up in two months to assess treatment efficacy and discuss Cosentyx initiation.    Important Information  Due to recent changes in healthcare laws, you may see results of your pathology and/or laboratory studies on MyChart before the doctors have had a chance to review them. We understand that in some cases there may be results that are confusing or concerning to you. Please  understand that not all results are received at the same time and often the doctors may need to interpret multiple results in order to provide you with the best plan of care or course of treatment. Therefore, we ask that you please give us  2 business days to thoroughly review all your results before contacting the office for clarification. Should we see a critical lab result, you will be contacted sooner.   If You Need Anything After Your Visit  If you have any questions or concerns for your doctor, please call our main line at [phone] If no one answers, please leave a voicemail as directed and we will return your call as soon as possible. Messages left after 4 pm will be answered the following business day.   You may also send us  a message via MyChart. We typically respond to MyChart messages within 1-2 business days.  For prescription refills, please ask your pharmacy to contact our office. Our fax number is [phone].  If you have an urgent issue when the clinic is closed that cannot wait until the next business day, you can page your doctor at the number below.    Please note that while we do our best to be available for urgent issues outside of office hours, we are not available 24/7.   If you have an urgent issue and are unable to reach us , you may choose to seek medical care at your doctor's office, retail clinic, urgent care center, or emergency room.  If you have a medical emergency, please immediately call 911 or go to the emergency department. In the event of inclement weather,  please call our main line at [phone] for an update on the status of any delays or closures.  Dermatology Medication Tips: Please keep the boxes that topical medications come in in order to help keep track of the instructions about where and how to use these. Pharmacies typically print the medication instructions only on the boxes and not directly on the medication tubes.   If your medication is  too expensive, please contact our office at [phone] or send us  a message through MyChart.   We are unable to tell what your co-pay for medications will be in advance as this is different depending on your insurance coverage. However, we may be able to find a substitute medication at lower cost or fill out paperwork to get insurance to cover a needed medication.   If a prior authorization is required to get your medication covered by your insurance company, please allow us  1-2 business days to complete this process.  Drug prices often vary depending on where the prescription is filled and some pharmacies may offer cheaper prices.  The website www.goodrx.com contains coupons for medications through different pharmacies. The prices here do not account for what the cost may be with help from insurance (it may be cheaper with your insurance), but the website can give you the price if you did not use any insurance.  - You can print the associated coupon and take it with your prescription to the pharmacy.  - You may also stop by our office during regular business hours and pick up a GoodRx coupon card.  - If you need your prescription sent electronically to a different pharmacy, notify our office through [facility] or by phone at [phone]

## 2024-04-10 ENCOUNTER — Ambulatory Visit (HOSPITAL_COMMUNITY): Payer: Self-pay

## 2024-04-10 ENCOUNTER — Ambulatory Visit: Admitting: Dermatology

## 2024-04-10 LAB — AEROBIC CULTURE W GRAM STAIN (SUPERFICIAL SPECIMEN): Culture: NORMAL

## 2024-06-19 ENCOUNTER — Ambulatory Visit: Admitting: Dermatology
